# Patient Record
Sex: Female | Born: 1955 | Race: White | Hispanic: No | Marital: Married | Smoking: Never smoker
Health system: Southern US, Community
[De-identification: ages and names within clinical notes are randomized; demographics above are authoritative.]

## PROBLEM LIST (undated history)

## (undated) DIAGNOSIS — R011 Cardiac murmur, unspecified: Secondary | ICD-10-CM

## (undated) DIAGNOSIS — N951 Menopausal and female climacteric states: Secondary | ICD-10-CM

## (undated) DIAGNOSIS — N390 Urinary tract infection, site not specified: Secondary | ICD-10-CM

## (undated) DIAGNOSIS — J189 Pneumonia, unspecified organism: Secondary | ICD-10-CM

## (undated) HISTORY — DX: Menopausal and female climacteric states: N95.1

## (undated) HISTORY — DX: Urinary tract infection, site not specified: N39.0

## (undated) HISTORY — DX: Cardiac murmur, unspecified: R01.1

## (undated) HISTORY — PX: COLONOSCOPY: SHX174

## (undated) HISTORY — DX: Pneumonia, unspecified organism: J18.9

## (undated) HISTORY — PX: FOOT SURGERY: SHX648

---

## 2008-09-17 HISTORY — PX: OTHER SURGICAL HISTORY: SHX169

## 2009-04-11 ENCOUNTER — Encounter: Admission: RE | Admit: 2009-04-11 | Discharge: 2009-07-10 | Payer: Self-pay | Admitting: Orthopedic Surgery

## 2009-10-17 ENCOUNTER — Encounter: Admission: RE | Admit: 2009-10-17 | Discharge: 2009-10-17 | Payer: Self-pay | Admitting: Family Medicine

## 2009-11-14 ENCOUNTER — Encounter: Admission: RE | Admit: 2009-11-14 | Discharge: 2009-11-14 | Payer: Self-pay | Admitting: Family Medicine

## 2010-01-11 ENCOUNTER — Encounter: Admission: RE | Admit: 2010-01-11 | Discharge: 2010-02-17 | Payer: Self-pay | Admitting: Family Medicine

## 2010-02-21 ENCOUNTER — Encounter: Admission: RE | Admit: 2010-02-21 | Discharge: 2010-05-22 | Payer: Self-pay | Admitting: Family Medicine

## 2010-05-25 ENCOUNTER — Encounter: Admission: RE | Admit: 2010-05-25 | Discharge: 2010-06-01 | Payer: Self-pay | Admitting: Family Medicine

## 2010-10-08 ENCOUNTER — Encounter: Payer: Self-pay | Admitting: Family Medicine

## 2010-11-16 HISTORY — PX: WRIST SURGERY: SHX841

## 2011-01-11 ENCOUNTER — Ambulatory Visit: Payer: 59 | Attending: Orthopedic Surgery | Admitting: Physical Therapy

## 2011-01-11 DIAGNOSIS — M2560 Stiffness of unspecified joint, not elsewhere classified: Secondary | ICD-10-CM | POA: Insufficient documentation

## 2011-01-11 DIAGNOSIS — IMO0001 Reserved for inherently not codable concepts without codable children: Secondary | ICD-10-CM | POA: Insufficient documentation

## 2011-01-11 DIAGNOSIS — M25539 Pain in unspecified wrist: Secondary | ICD-10-CM | POA: Insufficient documentation

## 2011-01-11 DIAGNOSIS — M6281 Muscle weakness (generalized): Secondary | ICD-10-CM | POA: Insufficient documentation

## 2011-01-15 ENCOUNTER — Encounter: Payer: Self-pay | Admitting: Physical Therapy

## 2011-01-16 ENCOUNTER — Ambulatory Visit: Payer: 59 | Attending: Orthopedic Surgery | Admitting: Physical Therapy

## 2011-01-16 DIAGNOSIS — IMO0001 Reserved for inherently not codable concepts without codable children: Secondary | ICD-10-CM | POA: Insufficient documentation

## 2011-01-16 DIAGNOSIS — M25539 Pain in unspecified wrist: Secondary | ICD-10-CM | POA: Insufficient documentation

## 2011-01-16 DIAGNOSIS — M6281 Muscle weakness (generalized): Secondary | ICD-10-CM | POA: Insufficient documentation

## 2011-01-16 DIAGNOSIS — M2560 Stiffness of unspecified joint, not elsewhere classified: Secondary | ICD-10-CM | POA: Insufficient documentation

## 2011-01-23 ENCOUNTER — Encounter: Payer: 59 | Admitting: Physical Therapy

## 2011-01-23 ENCOUNTER — Ambulatory Visit: Payer: 59 | Admitting: Physical Therapy

## 2011-01-24 ENCOUNTER — Ambulatory Visit: Payer: 59 | Admitting: Physical Therapy

## 2011-01-25 ENCOUNTER — Encounter: Payer: 59 | Admitting: Physical Therapy

## 2011-01-30 ENCOUNTER — Encounter: Payer: 59 | Admitting: Physical Therapy

## 2011-02-07 ENCOUNTER — Ambulatory Visit: Payer: 59 | Admitting: Physical Therapy

## 2011-02-14 ENCOUNTER — Ambulatory Visit: Payer: 59 | Admitting: Physical Therapy

## 2011-02-16 ENCOUNTER — Ambulatory Visit: Payer: 59 | Attending: Orthopedic Surgery | Admitting: Physical Therapy

## 2011-02-16 DIAGNOSIS — M6281 Muscle weakness (generalized): Secondary | ICD-10-CM | POA: Insufficient documentation

## 2011-02-16 DIAGNOSIS — M2560 Stiffness of unspecified joint, not elsewhere classified: Secondary | ICD-10-CM | POA: Insufficient documentation

## 2011-02-16 DIAGNOSIS — IMO0001 Reserved for inherently not codable concepts without codable children: Secondary | ICD-10-CM | POA: Insufficient documentation

## 2011-02-16 DIAGNOSIS — M25539 Pain in unspecified wrist: Secondary | ICD-10-CM | POA: Insufficient documentation

## 2011-02-21 ENCOUNTER — Ambulatory Visit: Payer: 59 | Admitting: Physical Therapy

## 2011-02-26 ENCOUNTER — Ambulatory Visit: Payer: 59 | Admitting: Physical Therapy

## 2011-06-26 ENCOUNTER — Encounter: Payer: Self-pay | Admitting: Obstetrics & Gynecology

## 2011-06-26 ENCOUNTER — Ambulatory Visit (INDEPENDENT_AMBULATORY_CARE_PROVIDER_SITE_OTHER): Payer: 59 | Admitting: Obstetrics & Gynecology

## 2011-06-26 VITALS — BP 114/77 | HR 66 | Temp 98.0°F | Resp 16 | Ht 64.0 in | Wt 110.0 lb

## 2011-06-26 DIAGNOSIS — Z0000 Encounter for general adult medical examination without abnormal findings: Secondary | ICD-10-CM

## 2011-06-26 DIAGNOSIS — Z01419 Encounter for gynecological examination (general) (routine) without abnormal findings: Secondary | ICD-10-CM

## 2011-06-26 DIAGNOSIS — Z113 Encounter for screening for infections with a predominantly sexual mode of transmission: Secondary | ICD-10-CM

## 2011-06-26 DIAGNOSIS — Z1272 Encounter for screening for malignant neoplasm of vagina: Secondary | ICD-10-CM
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
## 2011-07-02 ENCOUNTER — Telehealth: Payer: Self-pay | Admitting: *Deleted

## 2011-07-02 NOTE — Telephone Encounter (Signed)
Pt called with the compounding drug that she has been using which is progesterone 4% cream, Disp 30gm  And apply to inner arm every night.  This was called to [facility] in [place] per Dr. Marice Potter.

## 2011-07-10 ENCOUNTER — Encounter: Payer: Self-pay | Admitting: *Deleted

## 2011-07-13 ENCOUNTER — Other Ambulatory Visit: Payer: Self-pay | Admitting: Family Medicine

## 2011-07-13 ENCOUNTER — Ambulatory Visit (INDEPENDENT_AMBULATORY_CARE_PROVIDER_SITE_OTHER): Payer: 59 | Admitting: Family Medicine

## 2011-07-13 ENCOUNTER — Ambulatory Visit
Admission: RE | Admit: 2011-07-13 | Discharge: 2011-07-13 | Disposition: A | Discharge status: Home / Self Care | Payer: 59 | Source: Ambulatory Visit | Attending: Family Medicine | Admitting: Family Medicine

## 2011-07-13 ENCOUNTER — Encounter: Payer: Self-pay | Admitting: Family Medicine

## 2011-07-13 VITALS — BP 104/64 | HR 71 | Ht 64.0 in | Wt 113.0 lb

## 2011-07-13 DIAGNOSIS — M79676 Pain in unspecified toe(s): Secondary | ICD-10-CM

## 2011-07-13 DIAGNOSIS — Z1231 Encounter for screening mammogram for malignant neoplasm of breast: Secondary | ICD-10-CM

## 2011-07-13 DIAGNOSIS — R05 Cough: Secondary | ICD-10-CM

## 2011-07-13 DIAGNOSIS — J309 Allergic rhinitis, unspecified: Secondary | ICD-10-CM | POA: Insufficient documentation

## 2011-07-13 DIAGNOSIS — S92919A Unspecified fracture of unspecified toe(s), initial encounter for closed fracture: Secondary | ICD-10-CM

## 2011-07-13 DIAGNOSIS — D860 Sarcoidosis of lung: Secondary | ICD-10-CM

## 2011-07-13 DIAGNOSIS — M79609 Pain in unspecified limb: Secondary | ICD-10-CM
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
## 2011-07-13 NOTE — Patient Instructions (Signed)
I recommend nasal saline for irrigation Also try allegra once a day for the next 4-5 days Call if not better and we will call in a antibiotic for you

## 2011-07-17 ENCOUNTER — Ambulatory Visit
Admission: RE | Admit: 2011-07-17 | Discharge: 2011-07-17 | Disposition: A | Discharge status: Home / Self Care | Payer: 59 | Source: Ambulatory Visit | Attending: Family Medicine | Admitting: Family Medicine

## 2011-07-17 DIAGNOSIS — Z1231 Encounter for screening mammogram for malignant neoplasm of breast: Secondary | ICD-10-CM

## 2011-08-06 ENCOUNTER — Telehealth: Payer: Self-pay | Admitting: *Deleted

## 2011-08-06 MED ORDER — ZOLPIDEM TARTRATE 10 MG PO TABS: ORAL_TABLET | ORAL | Status: DC

## 2011-08-06 NOTE — Telephone Encounter (Signed)
Ok for Ambien.

## 2011-08-06 NOTE — Telephone Encounter (Signed)
Pt notified and med sent. KJ LPN

## 2011-08-06 NOTE — Telephone Encounter (Signed)
Pt flying to [place] at end of this wek and wants a sleep aid for the plane going and coming back and a few for the first few nights. Animal nutritionist

## 2011-12-25 ENCOUNTER — Encounter: Payer: Self-pay | Admitting: Family Medicine

## 2011-12-25 ENCOUNTER — Ambulatory Visit (INDEPENDENT_AMBULATORY_CARE_PROVIDER_SITE_OTHER): Payer: 59 | Admitting: Family Medicine

## 2011-12-25 VITALS — BP 102/68 | HR 68 | Temp 98.3°F | Ht 64.0 in | Wt 114.0 lb

## 2011-12-25 DIAGNOSIS — R221 Localized swelling, mass and lump, neck: Secondary | ICD-10-CM

## 2011-12-25 DIAGNOSIS — R220 Localized swelling, mass and lump, head: Secondary | ICD-10-CM

## 2011-12-25 NOTE — Patient Instructions (Signed)
We will call you with the referral. 

## 2011-12-25 NOTE — Progress Notes (Signed)
  Subjective:    Patient ID: Anna Pierce, female    DOB: 1956/06/24, 56 y.o.   MRN: [id number]  HPI Feels a constant pressure in her throat almost like a pill is stuck.   Says the esophagus feels more narrow. Has been there a month. Not worse with swallowing.  No dysphagia.   No hx of thyroid problems.  Thinks this started after a cold with ST.  Feels like it is on the left side. No ear pain ore pressure.   No GERD sxs.     Review of Systems     Objective:   Physical Exam  Constitutional: She is oriented to person, place, and time. She appears well-developed and well-nourished.  HENT:  Head: Normocephalic and atraumatic.  Right Ear: External ear normal.  Left Ear: External ear normal.  Nose: Nose normal.  Mouth/Throat: Oropharynx is clear and moist.       TMs and canals are clear.   Eyes: Conjunctivae and EOM are normal. Pupils are equal, round, and reactive to light.  Neck: Neck supple. No thyromegaly present.       Small bilat ant cerv LN that are symmetric.   Cardiovascular: Normal rate, regular rhythm and normal heart sounds.   Pulmonary/Chest: Effort normal.  Lymphadenopathy:    She has cervical adenopathy.  Neurological: She is alert and oriented to person, place, and time.  Skin: Skin is warm and dry.  Psychiatric: She has a normal mood and affect.          Assessment & Plan:  Lump in throat.- Unclear etiology at this point. I do not palpate any enlargement of the thyroid gland or any nodules. She does have some small anterior cervical lymph nodes but they're on the right and the left side. We could consider an ultrasound of the neck but I think we'll go ahead and proceed with ENT referral for further evaluation. Ear exam is completely normal. She is not having any dysphasia.

## 2012-03-11 ENCOUNTER — Other Ambulatory Visit: Payer: Self-pay | Admitting: Obstetrics & Gynecology

## 2012-03-24 ENCOUNTER — Other Ambulatory Visit: Payer: Self-pay | Admitting: *Deleted

## 2012-03-24 ENCOUNTER — Telehealth: Payer: Self-pay | Admitting: Obstetrics & Gynecology

## 2012-03-24 DIAGNOSIS — N951 Menopausal and female climacteric states: Secondary | ICD-10-CM

## 2012-03-24 MED ORDER — AMBULATORY NON FORMULARY MEDICATION: Status: DC

## 2012-03-24 NOTE — Telephone Encounter (Signed)
Pt called requesting RF on her Progesterone cream to [facility].  OK's per Dr Marice Potter.  Pt is scheduled to Return in the fall for annual.

## 2012-03-24 NOTE — Telephone Encounter (Signed)
Patient called, she needs her Rx for progesterone compounded cream called to [facility] in [place].

## 2012-12-08 ENCOUNTER — Telehealth: Payer: Self-pay

## 2012-12-08 MED ORDER — CIPROFLOXACIN HCL 500 MG PO TABS: 500.0000 mg | ORAL_TABLET | Freq: Two times a day (BID) | ORAL | Status: AC

## 2012-12-08 NOTE — Telephone Encounter (Signed)
Prescription sent to [facility] pharmacy.

## 2012-12-08 NOTE — Telephone Encounter (Signed)
Anna Pierce is going on a trip to [place] and wants a prescription of Cipro to take with her. Please advised.   [facility]

## 2012-12-09 NOTE — Telephone Encounter (Signed)
Patient advised.

## 2013-03-09 ENCOUNTER — Ambulatory Visit (HOSPITAL_BASED_OUTPATIENT_CLINIC_OR_DEPARTMENT_OTHER)
Admission: RE | Admit: 2013-03-09 | Discharge: 2013-03-09 | Disposition: A | Discharge status: Home / Self Care | Payer: 59 | Source: Ambulatory Visit | Attending: Family Medicine | Admitting: Family Medicine

## 2013-03-09 ENCOUNTER — Ambulatory Visit (INDEPENDENT_AMBULATORY_CARE_PROVIDER_SITE_OTHER): Payer: 59 | Admitting: Family Medicine

## 2013-03-09 ENCOUNTER — Encounter: Payer: Self-pay | Admitting: Family Medicine

## 2013-03-09 ENCOUNTER — Ambulatory Visit: Payer: 59

## 2013-03-09 VITALS — BP 104/64 | HR 75 | Wt 111.0 lb

## 2013-03-09 DIAGNOSIS — M545 Low back pain, unspecified: Secondary | ICD-10-CM | POA: Insufficient documentation

## 2013-03-09 DIAGNOSIS — M533 Sacrococcygeal disorders, not elsewhere classified: Secondary | ICD-10-CM

## 2013-03-09 DIAGNOSIS — M549 Dorsalgia, unspecified: Secondary | ICD-10-CM | POA: Insufficient documentation
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
## 2013-03-23 ENCOUNTER — Encounter: Payer: 59 | Admitting: Family Medicine

## 2013-06-30 ENCOUNTER — Ambulatory Visit (INDEPENDENT_AMBULATORY_CARE_PROVIDER_SITE_OTHER): Payer: 59 | Admitting: Family Medicine

## 2013-06-30 ENCOUNTER — Encounter: Payer: Self-pay | Admitting: Family Medicine

## 2013-06-30 VITALS — BP 95/60 | HR 68 | Wt 113.0 lb

## 2013-06-30 DIAGNOSIS — Z0000 Encounter for general adult medical examination without abnormal findings: Secondary | ICD-10-CM

## 2013-06-30 DIAGNOSIS — Z23 Encounter for immunization: Secondary | ICD-10-CM

## 2013-06-30 DIAGNOSIS — Z1231 Encounter for screening mammogram for malignant neoplasm of breast: Secondary | ICD-10-CM

## 2013-06-30 NOTE — Patient Instructions (Signed)
Keep up a regular exercise program and make sure you are eating a healthy diet Try to eat 4 servings of dairy a day, or if you are lactose intolerant take a calcium with vitamin D daily.  Your vaccines are up to date.   

## 2013-06-30 NOTE — Progress Notes (Signed)
  Subjective:     Anna Pierce is a 57 y.o. female and is here for a comprehensive physical exam. The patient reports problems - having some more frequent hotflashes over last 2 months. Using progesterine 4% cream daily.  Applies 1ml daily. She wonders if she needs to go on her dose to 5%.  History   Social History  . Marital Status: Married    Spouse Name: Minerva Areola    Number of Children: 4  . 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    Social History Main Topics  . Smoking status: Never Smoker   . Smokeless tobacco: Never Used  . Alcohol Use: No  . Drug Use: No  . Sexual Activity: Yes    Partners: Male   Other Topics Concern  . Not on file   Social History Narrative  . No narrative on file   Health Maintenance  Topic Date Due  . Influenza Vaccine  03/30/2014  . Mammogram  07/16/2013  . Pap Smear  06/25/2014  . Colonoscopy  09/17/2018  . Tetanus/tdap  07/01/2023    The following portions of the patient's history were reviewed and updated as appropriate: allergies, current medications, past family history, past medical history, past social history, past surgical history and problem list.  Review of Systems A comprehensive review of systems was negative.   Objective:    BP 95/60  Pulse 68  Wt 113 lb (51.256 kg)  BMI 19.39 kg/m2 General appearance: alert, cooperative and appears stated age Head: Normocephalic, without obvious abnormality, atraumatic Eyes: conj clear EOMI, PERRLA Ears: normal TM's and external ear canals both ears Nose: Nares normal. Septum midline. Mucosa normal. No drainage or sinus tenderness. Throat: lips, mucosa, and tongue normal; teeth and gums normal Neck: no adenopathy, no carotid bruit, no JVD, supple, symmetrical, trachea midline and thyroid not enlarged, symmetric, no tenderness/mass/nodules Back: symmetric, no curvature. ROM normal. No CVA tenderness. Lungs: clear to auscultation  bilaterally Breasts: normal appearance, no masses or tenderness Heart: regular rate and rhythm, S1, S2 normal, no murmur, click, rub or gallop Abdomen: soft, non-tender; bowel sounds normal; no masses,  no organomegaly Extremities: extremities normal, atraumatic, no cyanosis or edema Pulses: 2+ and symmetric Skin: Skin color, texture, turgor normal. No rashes or lesions Lymph nodes: Cervical, supraclavicular, and axillary nodes normal. Neurologic: Alert and oriented X 3, normal strength and tone. Normal symmetric reflexes. Normal coordination and gait    Assessment:    Healthy female exam.      Plan:     See After Visit Summary for Counseling Recommendations  Keep up a regular exercise program and make sure you are eating a healthy diet Try to eat 4 servings of dairy a day, or if you are lactose intolerant take a calcium with vitamin D daily.  Your vaccines are up to date.   Hot flashes-will check hormone levels. Adjust dose on her progesterone cream if needed. We'll also check thyroid levels. Due for Pap smear next year. Due for mammogram this year. Ordered.

## 2013-07-06 LAB — COMPLETE METABOLIC PANEL WITH GFR
AST: 22 U/L (ref 0–37)
Albumin: 4.7 g/dL (ref 3.5–5.2)
BUN: 14 mg/dL (ref 6–23)
CO2: 27 mEq/L (ref 19–32)
Calcium: 9.4 mg/dL (ref 8.4–10.5)
Chloride: 101 mEq/L (ref 96–112)
Creat: 0.96 mg/dL (ref 0.50–1.10)
GFR, Est African American: 77 mL/min
GFR, Est Non African American: 67 mL/min
Glucose, Bld: 89 mg/dL (ref 70–99)
Potassium: 4.2 mEq/L (ref 3.5–5.3)

## 2013-07-06 LAB — LIPID PANEL
Cholesterol: 160 mg/dL (ref 0–200)
Triglycerides: 65 mg/dL (ref <150)
VLDL: 13 mg/dL (ref 0–40)

## 2013-07-06 LAB — TSH: TSH: 1.527 u[IU]/mL (ref 0.350–4.500)

## 2013-07-07 LAB — FOLLICLE STIMULATING HORMONE: FSH: 91 m[IU]/mL

## 2013-07-07 LAB — PROGESTERONE: Progesterone: 0.4 ng/mL

## 2013-07-07 LAB — ESTRADIOL: Estradiol: 85.5 pg/mL

## 2013-07-09 ENCOUNTER — Other Ambulatory Visit: Payer: Self-pay | Admitting: *Deleted

## 2013-07-09 ENCOUNTER — Encounter: Payer: Self-pay | Admitting: *Deleted

## 2013-07-09 DIAGNOSIS — N951 Menopausal and female climacteric states: Secondary | ICD-10-CM

## 2013-07-10 ENCOUNTER — Other Ambulatory Visit: Payer: Self-pay | Admitting: Family Medicine

## 2013-07-10 MED ORDER — PROGESTERONE MICRONIZED POWD: Status: DC

## 2013-07-14 ENCOUNTER — Ambulatory Visit (INDEPENDENT_AMBULATORY_CARE_PROVIDER_SITE_OTHER): Payer: 59

## 2013-07-14 DIAGNOSIS — Z1231 Encounter for screening mammogram for malignant neoplasm of breast: Secondary | ICD-10-CM

## 2013-08-17 ENCOUNTER — Ambulatory Visit (INDEPENDENT_AMBULATORY_CARE_PROVIDER_SITE_OTHER): Payer: 59

## 2013-08-17 ENCOUNTER — Other Ambulatory Visit: Payer: Self-pay | Admitting: Family Medicine

## 2013-08-17 ENCOUNTER — Encounter: Payer: Self-pay | Admitting: Physician Assistant

## 2013-08-17 ENCOUNTER — Ambulatory Visit (INDEPENDENT_AMBULATORY_CARE_PROVIDER_SITE_OTHER): Payer: 59 | Admitting: Physician Assistant

## 2013-08-17 ENCOUNTER — Inpatient Hospital Stay
Admission: RE | Admit: 2013-08-17 | Discharge: 2013-08-17 | Disposition: A | Discharge status: Home / Self Care | Payer: Self-pay | Source: Ambulatory Visit | Attending: Physician Assistant | Admitting: Physician Assistant

## 2013-08-17 VITALS — BP 124/67 | HR 59 | Resp 16 | Wt 113.0 lb

## 2013-08-17 DIAGNOSIS — S59909A Unspecified injury of unspecified elbow, initial encounter: Secondary | ICD-10-CM

## 2013-08-17 DIAGNOSIS — S6992XA Unspecified injury of left wrist, hand and finger(s), initial encounter: Secondary | ICD-10-CM

## 2013-08-17 DIAGNOSIS — R296 Repeated falls: Secondary | ICD-10-CM

## 2013-08-17 DIAGNOSIS — S6990XA Unspecified injury of unspecified wrist, hand and finger(s), initial encounter: Secondary | ICD-10-CM

## 2013-08-17 DIAGNOSIS — Z79890 Hormone replacement therapy (postmenopausal): Secondary | ICD-10-CM

## 2013-08-17 MED ORDER — OXYCODONE-ACETAMINOPHEN 5-325 MG PO TABS: ORAL_TABLET | ORAL | Status: DC

## 2013-08-17 NOTE — Patient Instructions (Signed)
Wrist Sprain °with Rehab °A sprain is an injury in which a ligament that maintains the proper alignment of a joint is partially or completely torn. The ligaments of the wrist are susceptible to sprains. Sprains are classified into three categories. Grade 1 sprains cause pain, but the tendon is not lengthened. Grade 2 sprains include a lengthened ligament because the ligament is stretched or partially ruptured. With grade 2 sprains there is still function, although the function may be diminished. Grade 3 sprains are characterized by a complete tear of the tendon or muscle, and function is usually impaired. °SYMPTOMS  °· Pain tenderness, inflammation, and/or bruising (contusion) of the injury. °· A "pop" or tear felt and/or heard at the time of injury. °· Decreased wrist function. °CAUSES  °A wrist sprain occurs when a force is placed on one or more ligaments that is greater than it/they can withstand. Common mechanisms of injury include: °· Catching a ball with you hands. °· Repetitive and/ or strenuous extension or flexion of the wrist. °RISK INCREASES WITH: °· Previous wrist injury. °· Contact sports (boxing or wrestling). °· Activities in which falling is common. °· Poor strength and flexibility. °· Improperly fitted or padded protective equipment. °PREVENTION °· Warm up and stretch properly before activity. °· Allow for adequate recovery between workouts. °· Maintain physical fitness: °· Strength, flexibility, and endurance. °· Cardiovascular fitness. °· Protect the wrist joint by limiting its motion with the use of taping, braces, or splints. °· Protect the wrist after injury for 6 to 12 months. °PROGNOSIS  °The prognosis for wrist sprains depends on the degree of injury. Grade 1 sprains require 2 to 6 weeks of treatment. Grade 2 sprains require 6 to 8 weeks of treatment, and grade 3 sprains require up to 12 weeks.  °RELATED COMPLICATIONS  °· Prolonged healing time, if improperly treated or  re-injured. °· Recurrent symptoms that result in a chronic problem. °· Injury to nearby structures (bone, cartilage, nerves, or tendons). °· Arthritis of the wrist. °· Inability to compete in athletics at a high level. °· Wrist stiffness or weakness. °· Progression to a complete rupture of the ligament. °TREATMENT  °Treatment initially involves resting from any activities that aggravate the symptoms, and the use of ice and medications to help reduce pain and inflammation. Your caregiver may recommend immobilizing the wrist for a period of time in order to reduce stress on the ligament and allow for healing. After immobilization it is important to perform strengthening and stretching exercises to help regain strength and a full range of motion. These exercises may be completed at home or with a therapist. Surgery is not usually required for wrist sprains, unless the ligament has been ruptured (grade 3 sprain). °MEDICATION  °· If pain medication is necessary, then nonsteroidal anti-inflammatory medications, such as aspirin and ibuprofen, or other minor pain relievers, such as acetaminophen, are often recommended. °· Do not take pain medication for 7 days before surgery. °· Prescription pain relievers may be given if deemed necessary by your caregiver. Use only as directed and only as much as you need. °HEAT AND COLD °· Cold treatment (icing) relieves pain and reduces inflammation. Cold treatment should be applied for 10 to 15 minutes every 2 to 3 hours for inflammation and pain and immediately after any activity that aggravates your symptoms. Use ice packs or massage the area with a piece of ice (ice massage). °· Heat treatment may be used prior to performing the stretching and strengthening activities prescribed by your   caregiver, physical therapist, or athletic trainer. Use a heat pack or soak your injury in warm water. °SEEK MEDICAL CARE IF: °· Treatment seems to offer no benefit, or the condition worsens. °· Any  medications produce adverse side effects. °EXERCISES °RANGE OF MOTION (ROM) AND STRETCHING EXERCISES - Wrist Sprain  °These exercises may help you when beginning to rehabilitate your injury. Your symptoms may resolve with or without further involvement from your physician, physical therapist or athletic trainer. While completing these exercises, remember:  °· Restoring tissue flexibility helps normal motion to return to the joints. This allows healthier, less painful movement and activity. °· An effective stretch should be held for at least 30 seconds. °· A stretch should never be painful. You should only feel a gentle lengthening or release in the stretched tissue. °RANGE OF MOTION  Wrist Flexion, Active-Assisted °· Extend your right / left elbow with your fingers pointing down.* °· Gently pull the back of your hand towards you until you feel a gentle stretch on the top of your forearm. °· Hold this position for __________ seconds. °Repeat __________ times. Complete this exercise __________ times per day.  °*If directed by your physician, physical therapist or athletic trainer, complete this stretch with your elbow bent rather than extended. °RANGE OF MOTION  Wrist Extension, Active-Assisted °· Extend your right / left elbow and turn your palm upwards.* °· Gently pull your palm/fingertips back so your wrist extends and your fingers point more toward the ground. °· You should feel a gentle stretch on the inside of your forearm. °· Hold this position for __________ seconds. °Repeat __________ times. Complete this exercise __________ times per day. °*If directed by your physician, physical therapist or athletic trainer, complete this stretch with your elbow bent, rather than extended. °RANGE OF MOTION  Supination, Active °· Stand or sit with your elbows at your side. Bend your right / left elbow to 90 degrees. °· Turn your palm upward until you feel a gentle stretch on the inside of your forearm. °· Hold this position  for __________ seconds. Slowly release and return to the starting position. °Repeat __________ times. Complete this stretch __________ times per day.  °RANGE OF MOTION  Pronation, Active °· Stand or sit with your elbows at your side. Bend your right / left elbow to 90 degrees. °· Turn your palm downward until you feel a gentle stretch on the top of your forearm. °· Hold this position for __________ seconds. Slowly release and return to the starting position. °Repeat __________ times. Complete this stretch __________ times per day.  °STRETCH - Wrist Flexion °· Place the back of your right / left hand on a tabletop leaving your elbow slightly bent. Your fingers should point away from your body. °· Gently press the back of your hand down onto the table by straightening your elbow. You should feel a stretch on the top of your forearm. °· Hold this position for __________ seconds. °Repeat __________ times. Complete this stretch __________ times per day.  °STRETCH  Wrist Extension °· Place your right / left fingertips on a tabletop leaving your elbow slightly bent. Your fingers should point backwards. °· Gently press your fingers and palm down onto the table by straightening your elbow. You should feel a stretch on the inside of your forearm. °· Hold this position for __________ seconds. °Repeat __________ times. Complete this stretch __________ times per day.  °STRENGTHENING EXERCISES - Wrist Sprain °These exercises may help you when beginning to rehabilitate your injury.   They may resolve your symptoms with or without further involvement from your physician, physical therapist or athletic trainer. While completing these exercises, remember:  °· Muscles can gain both the endurance and the strength needed for everyday activities through controlled exercises. °· Complete these exercises as instructed by your physician, physical therapist or athletic trainer. Progress with the resistance and repetition exercises only as your  caregiver advises. °STRENGTH Wrist Flexors °· Sit with your right / left forearm palm-up and fully supported. Your elbow should be resting below the height of your shoulder. Allow your wrist to extend over the edge of the surface. °· Loosely holding a __________ weight or a piece of rubber exercise band/tubing, slowly curl your hand up toward your forearm. °· Hold this position for __________ seconds. Slowly lower the wrist back to the starting position in a controlled manner. °Repeat __________ times. Complete this exercise __________ times per day.  °STRENGTH  Wrist Extensors °· Sit with your right / left forearm palm-down and fully supported. Your elbow should be resting below the height of your shoulder. Allow your wrist to extend over the edge of the surface. °· Loosely holding a __________ weight or a piece of rubber exercise band/tubing, slowly curl your hand up toward your forearm. °· Hold this position for __________ seconds. Slowly lower the wrist back to the starting position in a controlled manner. °Repeat __________ times. Complete this exercise __________ times per day.  °STRENGTH - Ulnar Deviators °· Stand with a ____________________ weight in your right / left hand, or sit holding on to the rubber exercise band/tubing with your opposite arm supported. °· Move your wrist so that your pinkie travels toward your forearm and your thumb moves away from your forearm. °· Hold this position for __________ seconds and then slowly lower the wrist back to the starting position. °Repeat __________ times. Complete this exercise __________ times per day °STRENGTH - Radial Deviators °· Stand with a ____________________ weight in your °· right / left hand, or sit holding on to the rubber exercise band/tubing with your arm supported. °· Raise your hand upward in front of you or pull up on the rubber tubing. °· Hold this position for __________ seconds and then slowly lower the wrist back to the starting  position. °Repeat __________ times. Complete this exercise __________ times per day. °STRENGTH  Forearm Supinators °· Sit with your right / left forearm supported on a table, keeping your elbow below shoulder height. Rest your hand over the edge, palm down. °· Gently grip a hammer or a soup ladle. °· Without moving your elbow, slowly turn your palm and hand upward to a "thumbs-up" position. °· Hold this position for __________ seconds. Slowly return to the starting position. °Repeat __________ times. Complete this exercise __________ times per day.  °STRENGTH  Forearm Pronators °· Sit with your right / left forearm supported on a table, keeping your elbow below shoulder height. Rest your hand over the edge, palm up. °· Gently grip a hammer or a soup ladle. °· Without moving your elbow, slowly turn your palm and hand upward to a "thumbs-up" position. °· Hold this position for __________ seconds. Slowly return to the starting position. °Repeat __________ times. Complete this exercise __________ times per day.  °STRENGTH - Grip °· Grasp a tennis ball, a dense sponge, or a large, rolled sock in your hand. °· Squeeze as hard as you can without increasing any pain. °· Hold this position for __________ seconds. Release your grip slowly. °Repeat   __________ times. Complete this exercise __________ times per day.  °Document Released: 09/03/2005 Document Revised: 11/26/2011 Document Reviewed: 12/16/2008 °[employer]® Patient Information ©2014 [employer]. ° °

## 2013-08-17 NOTE — Progress Notes (Signed)
   Subjective:    Patient ID: Anna Pierce, female    DOB: September 16, 1956, 57 y.o.   MRN: [id number]  HPI Patient is a 57 yo female who fell backwards onto her left wrist after saddling her horse one day ago. She has had a lot of swelling and pain. She has broken her right wrist before and wanted to make sure not broken. She iced and put in ace bandage and taken ibuprofen which has helped some. 8/10 pain with movment, less as long as she keeps still.      Review of Systems     Objective:   Physical Exam  Musculoskeletal:  Left hand significant edema around all MCP joints and into phalanges. Strength and hand grip decreased significantly. Intact sensation to dull and sharp touch. Decreased ROM at wrist and phalanges. Unable to close fist. Pain with palpation over lateral ulnar head and into hand. No bruising.           Assessment & Plan:  Left wrist injury- Xray stat was negative for fracture which was surprising. I viewed xray myself and also confirmed no fracture. It appears that patient has badly sprained wrist. Gave percocet for pain. Per pt she tolerates better. Wrapped in ace bandage. Encouraged Ice and Ibuprofen. After 3 days of rest, start with ROM exercises. If no improvement of pain or ROM will get MRI of hand.

## 2013-09-02 ENCOUNTER — Other Ambulatory Visit: Payer: Self-pay | Admitting: *Deleted

## 2013-09-02 LAB — LUTEINIZING HORMONE: LH: 56.9 m[IU]/mL

## 2013-09-02 LAB — FOLLICLE STIMULATING HORMONE: FSH: 119.2 m[IU]/mL — ABNORMAL HIGH

## 2013-09-02 LAB — TSH: TSH: 1.333 u[IU]/mL (ref 0.350–4.500)

## 2013-09-02 LAB — ESTRADIOL: Estradiol: 30.4 pg/mL

## 2013-09-04 ENCOUNTER — Encounter: Payer: Self-pay | Admitting: Family Medicine

## 2013-09-07 ENCOUNTER — Ambulatory Visit (INDEPENDENT_AMBULATORY_CARE_PROVIDER_SITE_OTHER): Payer: 59 | Admitting: Sports Medicine

## 2013-09-07 ENCOUNTER — Other Ambulatory Visit: Payer: 59

## 2013-09-07 ENCOUNTER — Encounter: Payer: Self-pay | Admitting: Sports Medicine

## 2013-09-07 ENCOUNTER — Telehealth: Payer: Self-pay | Admitting: *Deleted

## 2013-09-07 VITALS — BP 120/79 | HR 62 | Wt 112.0 lb

## 2013-09-07 DIAGNOSIS — S52599A Other fractures of lower end of unspecified radius, initial encounter for closed fracture: Secondary | ICD-10-CM

## 2013-09-07 DIAGNOSIS — S52502A Unspecified fracture of the lower end of left radius, initial encounter for closed fracture: Secondary | ICD-10-CM

## 2013-09-07 HISTORY — DX: Unspecified fracture of the lower end of left radius, initial encounter for closed fracture: S52.502A

## 2013-09-07 NOTE — Progress Notes (Signed)
   Subjective:    I'm seeing this patient as a consultation for:  Anna Gaw, PA-C  CC: Left wrist pain  HPI: This is a pleasant 57 year old female, 3 weeks ago she fell, and hyperflexed her wrist, she had immediate pain, swelling, and bruising which has persisted. Initial x-rays were negative, unfortunately she has continued to have pain and presents to me for further evaluation and treatment. Pain is localized over the distal or so radius without radiation, she does have some numbness in the median nerve distribution. Symptoms are moderate, persistent.  Past medical history, Surgical history, Family history not pertinant except as noted below, Social history, Allergies, and medications have been entered into the medical record, reviewed, and no changes needed.   Review of Systems: No headache, visual changes, nausea, vomiting, diarrhea, constipation, dizziness, abdominal pain, skin rash, fevers, chills, night sweats, weight loss, swollen lymph nodes, body aches, joint swelling, muscle aches, chest pain, shortness of breath, mood changes, visual or auditory hallucinations.   Objective:   General: Well Developed, well nourished, and in no acute distress.  Neuro/Psych: Alert and oriented x3, extra-ocular muscles intact, able to move all 4 extremities, sensation grossly intact. Skin: Warm and dry, no rashes noted.  Respiratory: Not using accessory muscles, speaking in full sentences, trachea midline.  Cardiovascular: Pulses palpable, no extremity edema. Abdomen: Does not appear distended. Left wrist: Tender to palpation over Listers tubercle, there is visible swelling and bruising, she does get some positive Tinel's sign over carpal tunnel. Good motion, good strength.  X-rays were personally reviewed, I do see what appears to be an avulsion from either the listers tubercle/radial styloid process. This was discussed with the radiologist, an addendum was placed on the x-ray report.  The  patient was sent down for confirmation CT scan however at the last minute declined.  Impression and Recommendations:   This case required medical decision making of moderate complexity.

## 2013-09-07 NOTE — Assessment & Plan Note (Addendum)
Velcro brace. CT for confirmation, I do see a subtle fracture through listers tubercle. Return in 3 weeks to recheck. Continue Percocet.  I billed a fracture code for this visit, all subsequent visits for this complaint will be "post-op checks" in the global period.  The patient was sent down for a confirmatory CT, however at the last minute she declined citing worry about radiation from a CT.

## 2013-09-07 NOTE — Telephone Encounter (Signed)
PA obtained for CT Lt Wrist w/o. Auth # [id number].  Meyer Cory, LPN

## 2013-09-14 ENCOUNTER — Encounter: Payer: Self-pay | Admitting: Sports Medicine

## 2013-10-01 ENCOUNTER — Ambulatory Visit (INDEPENDENT_AMBULATORY_CARE_PROVIDER_SITE_OTHER): Payer: 59 | Admitting: Sports Medicine

## 2013-10-01 ENCOUNTER — Encounter: Payer: Self-pay | Admitting: Sports Medicine

## 2013-10-01 VITALS — BP 116/73 | HR 65 | Ht 64.0 in | Wt 115.0 lb

## 2013-10-01 DIAGNOSIS — S52599A Other fractures of lower end of unspecified radius, initial encounter for closed fracture: Secondary | ICD-10-CM

## 2013-10-01 DIAGNOSIS — S52502A Unspecified fracture of the lower end of left radius, initial encounter for closed fracture: Secondary | ICD-10-CM

## 2013-10-01 NOTE — Progress Notes (Signed)
  Subjective: 6 weeks status post fracture of the radial styloid process, avulsion. Pain continues to improve. She is now experiencing some burning sensation along the flexor carpi ulnaris tendon.   Objective: General: Well-developed, well-nourished, and in no acute distress. Left wrist: still tender to palpation over Listers Tubercle. There is also some pain over the flexor carpi ulnaris tendon. Excellent motion, excellent strength. Negative Tinel's and Phalen's signs.  Assessment/plan:

## 2013-10-01 NOTE — Assessment & Plan Note (Signed)
Drastic improvement since last visit. She is 6 weeks out. She is having some burning pain that she localizes along the flexor carpi ulnaris tendon.  continue wrist brace for 2 weeks, then 2 weeks of rehabilitation. Return in one month, we can further evaluate the FCU tendon if still symptomatic at that time.

## 2014-06-29 ENCOUNTER — Telehealth: Payer: Self-pay

## 2014-06-29 DIAGNOSIS — N951 Menopausal and female climacteric states: Secondary | ICD-10-CM

## 2014-06-29 DIAGNOSIS — Z0000 Encounter for general adult medical examination without abnormal findings: Secondary | ICD-10-CM

## 2014-06-29 NOTE — Telephone Encounter (Signed)
Anna Pierce would like her labs done prior to her physical appointment. What labs would you like me to order?

## 2014-06-29 NOTE — Telephone Encounter (Signed)
Faxed to lab.

## 2014-06-29 NOTE — Telephone Encounter (Signed)
Cmp, lipids. If still on hormones then check estradiol, fsh, lh, and progesterone.

## 2014-07-01 LAB — COMPLETE METABOLIC PANEL WITH GFR
ALBUMIN: 4.3 g/dL (ref 3.5–5.2)
ALK PHOS: 70 U/L (ref 39–117)
ALT: 22 U/L (ref 0–35)
AST: 19 U/L (ref 0–37)
BUN: 10 mg/dL (ref 6–23)
CALCIUM: 9.5 mg/dL (ref 8.4–10.5)
CHLORIDE: 100 meq/L (ref 96–112)
CO2: 26 mEq/L (ref 19–32)
Creat: 1.02 mg/dL (ref 0.50–1.10)
GFR, EST AFRICAN AMERICAN: 71 mL/min
GFR, Est Non African American: 62 mL/min
Glucose, Bld: 88 mg/dL (ref 70–99)
Potassium: 4.1 mEq/L (ref 3.5–5.3)
Sodium: 136 mEq/L (ref 135–145)
Total Bilirubin: 0.9 mg/dL (ref 0.2–1.2)
Total Protein: 7 g/dL (ref 6.0–8.3)

## 2014-07-01 LAB — LIPID PANEL
CHOLESTEROL: 142 mg/dL (ref 0–200)
HDL: 58 mg/dL (ref >39)
LDL Cholesterol: 71 mg/dL (ref 0–99)
Total CHOL/HDL Ratio: 2.4 Ratio
Triglycerides: 67 mg/dL (ref <150)
VLDL: 13 mg/dL (ref 0–40)

## 2014-07-02 LAB — ESTRADIOL, FREE

## 2014-07-05 ENCOUNTER — Other Ambulatory Visit: Payer: Self-pay

## 2014-07-05 ENCOUNTER — Encounter: Payer: Self-pay | Admitting: Family Medicine

## 2014-07-05 ENCOUNTER — Ambulatory Visit (INDEPENDENT_AMBULATORY_CARE_PROVIDER_SITE_OTHER): Payer: 59 | Admitting: Family Medicine

## 2014-07-05 ENCOUNTER — Other Ambulatory Visit: Payer: Self-pay | Admitting: Family Medicine

## 2014-07-05 ENCOUNTER — Other Ambulatory Visit (HOSPITAL_COMMUNITY)
Admission: RE | Admit: 2014-07-05 | Discharge: 2014-07-05 | Disposition: A | Discharge status: Home / Self Care | Payer: 59 | Source: Ambulatory Visit | Attending: Family Medicine | Admitting: Family Medicine

## 2014-07-05 VITALS — BP 107/53 | HR 60 | Ht 64.0 in | Wt 114.0 lb

## 2014-07-05 DIAGNOSIS — Z0189 Encounter for other specified special examinations: Secondary | ICD-10-CM

## 2014-07-05 DIAGNOSIS — R140 Abdominal distension (gaseous): Secondary | ICD-10-CM

## 2014-07-05 DIAGNOSIS — R143 Flatulence: Secondary | ICD-10-CM

## 2014-07-05 DIAGNOSIS — Z1231 Encounter for screening mammogram for malignant neoplasm of breast: Secondary | ICD-10-CM

## 2014-07-05 DIAGNOSIS — Z780 Asymptomatic menopausal state: Secondary | ICD-10-CM

## 2014-07-05 DIAGNOSIS — Z0000 Encounter for general adult medical examination without abnormal findings: Secondary | ICD-10-CM

## 2014-07-05 DIAGNOSIS — Z1151 Encounter for screening for human papillomavirus (HPV): Secondary | ICD-10-CM | POA: Insufficient documentation

## 2014-07-05 DIAGNOSIS — R142 Eructation: Secondary | ICD-10-CM

## 2014-07-05 DIAGNOSIS — Z01419 Encounter for gynecological examination (general) (routine) without abnormal findings: Secondary | ICD-10-CM | POA: Diagnosis present

## 2014-07-05 DIAGNOSIS — R141 Gas pain: Secondary | ICD-10-CM

## 2014-07-05 LAB — PROGESTERONE: PROGESTERONE: 0.8 ng/mL

## 2014-07-05 LAB — ESTRADIOL: Estradiol: 105 pg/mL

## 2014-07-05 LAB — FOLLICLE STIMULATING HORMONE: FSH: 44.7 m[IU]/mL

## 2014-07-05 LAB — LUTEINIZING HORMONE: LH: 25.8 m[IU]/mL

## 2014-07-05 MED ORDER — PROGESTERONE MICRONIZED POWD: Status: DC

## 2014-07-05 NOTE — Progress Notes (Signed)
Subjective:     Anna Pierce is a 58 y.o. female and is here for a comprehensive physical exam. The patient reports no problems.  Feeling really bloated with her bowels and gassy.  Says she is taking a probiotic.  She has gained a couple of pounds.  She feels like retaining water. She drinks aloe drinks to help with constipation.  No milks prodcuts.  No blood int eh stool. Started about a few months ago.    History   Social History  . Marital Status: Married    Spouse Name: Randall Hiss    Number of Children: 4  . Years of Education: N/A   Occupational History  . 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    Social History Main Topics  . Smoking status: Never Smoker   . Smokeless tobacco: Never Used  . Alcohol Use: No  . Drug Use: No  . Sexual Activity: Yes    Partners: Male   Other Topics Concern  . Not on file   Social History Narrative  . No narrative on file   Health Maintenance  Topic Date Due  . Pap Smear  06/25/2014  . Influenza Vaccine  07/06/2015 (Originally 04/17/2014)  . Mammogram  07/15/2015  . Colonoscopy  09/17/2018  . Tetanus/tdap  07/01/2023    The following portions of the patient's history were reviewed and updated as appropriate: allergies, current medications, past family history, past medical history, past social history, past surgical history and problem list.  Review of Systems A comprehensive review of systems was negative.   Objective:    There were no vitals taken for this visit. General appearance: alert, cooperative and appears older than stated age Head: Normocephalic, without obvious abnormality, atraumatic Eyes: conj clear, EOMi, PEERLA Ears: normal TM's and external ear canals both ears Nose: Nares normal. Septum midline. Mucosa normal. No drainage or sinus tenderness. Throat: lips, mucosa, and tongue normal; teeth and gums normal Neck: no adenopathy, no carotid bruit, no JVD, supple, symmetrical, trachea midline and thyroid not  enlarged, symmetric, no tenderness/mass/nodules Back: symmetric, no curvature. ROM normal. No CVA tenderness. Lungs: clear to auscultation bilaterally Breasts: normal appearance, no masses or tenderness Heart: regular rate and rhythm, S1, S2 normal, no murmur, click, rub or gallop Abdomen: soft, non-tender; bowel sounds normal; no masses,  no organomegaly Pelvic: cervix normal in appearance, external genitalia normal, no adnexal masses or tenderness, no cervical motion tenderness, rectovaginal septum normal, uterus normal size, shape, and consistency and vagina normal without discharge Extremities: extremities normal, atraumatic, no cyanosis or edema Pulses: 2+ and symmetric Skin: Skin color, texture, turgor normal. No rashes or lesions Lymph nodes: Cervical, supraclavicular, and axillary nodes normal. Neurologic: Alert and oriented X 3, normal strength and tone. Normal symmetric reflexes. Normal coordination and gait    Assessment:    Healthy female exam.      Plan:     See After Visit Summary for Counseling Recommendations  Keep up a regular exercise program and make sure you are eating a healthy diet Try to eat 4 servings of dairy a day, or if you are lactose intolerant take a calcium with vitamin D daily.  Your vaccines are up to date.  Will call with pap smear results when available.    Bloating - unclear etiology. We'll check her thyroid as well as check her for gluten insensitivity. Ciardi avoids dairy products. And Ciardi takes a probiotic in addition to using allergies to help move her  bowels more normally.

## 2014-07-05 NOTE — Patient Instructions (Signed)
complete physical examination Keep up a regular exercise program and make sure you are eating a healthy diet Try to eat 4 servings of dairy a day, or if you are lactose intolerant take a calcium with vitamin D daily.  Your vaccines are up to date.   

## 2014-07-06 LAB — TSH: TSH: 1.52 u[IU]/mL (ref 0.350–4.500)

## 2014-07-06 LAB — TISSUE TRANSGLUTAMINASE, IGA: TISSUE TRANSGLUTAMINASE AB, IGA: 6.3 U/mL (ref <20)

## 2014-07-06 LAB — GLIADIN ANTIBODIES, SERUM
GLIADIN IGA: 6.3 U/mL (ref <20)
Gliadin IgG: 4.3 U/mL (ref <20)

## 2014-07-06 LAB — CYTOLOGY - PAP

## 2014-07-07 NOTE — Progress Notes (Signed)
Quick Note:  Call patient: Your Pap smear is normal. Repeat in 3 years. ______ 

## 2014-07-19 ENCOUNTER — Encounter: Payer: Self-pay | Admitting: Family Medicine

## 2014-11-13 ENCOUNTER — Emergency Department
Admission: EM | Admit: 2014-11-13 | Discharge: 2014-11-13 | Disposition: A | Discharge status: Home / Self Care | Payer: 59 | Source: Home / Self Care | Attending: Family Medicine | Admitting: Family Medicine

## 2014-11-13 DIAGNOSIS — J069 Acute upper respiratory infection, unspecified: Secondary | ICD-10-CM

## 2014-11-13 MED ORDER — FLUCONAZOLE 150 MG PO TABS: 150.0000 mg | ORAL_TABLET | Freq: Once | ORAL | Status: DC

## 2014-11-13 MED ORDER — AZITHROMYCIN 250 MG PO TABS: ORAL_TABLET | ORAL | Status: DC

## 2014-11-13 NOTE — ED Notes (Signed)
Patient states that she is only here today because she would like to have a antibiotic prescribed for preventative reasons.  She states that she has been staying with her mother who was diagnosed with Mycroplasma pneumonia approx 6 weeks ago. She only has mild cold symptoms today.

## 2014-11-13 NOTE — Discharge Instructions (Signed)
Take plain guaifenesin (600mg  or1200mg  extended release tabs such as Mucinex) twice daily, with plenty of water, for cough and congestion.  May add Pseudoephedrine for sinus congestion.  Get adequate rest.   May use Afrin nasal spray (or generic oxymetazoline) twice daily for about 5 days.  Also recommend using saline nasal spray several times daily and saline nasal irrigation (AYR is a common brand).   Try warm salt water gargles for sore throat.  Stop all antihistamines for now, and other non-prescription cough/cold preparations. May take Ibuprofen 200mg , 4 tabs every 8 hours with food for body aches, headache, etc. May take Delsym Cough Suppressant at bedtime for nighttime cough.  Follow-up with family doctor if not improving about10 days.
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
## 2015-01-12 ENCOUNTER — Other Ambulatory Visit: Payer: Self-pay | Admitting: *Deleted

## 2015-01-12 MED ORDER — ZOLPIDEM TARTRATE 10 MG PO TABS: ORAL_TABLET | ORAL | Status: DC

## 2015-01-12 NOTE — Telephone Encounter (Signed)
Pt is going to [place] and is requesting something for sleep. rx sent.Audelia Hives Marvell

## 2016-02-03 ENCOUNTER — Other Ambulatory Visit: Payer: Self-pay | Admitting: *Deleted

## 2016-02-03 ENCOUNTER — Encounter: Payer: Self-pay | Admitting: Physician Assistant

## 2016-02-03 ENCOUNTER — Ambulatory Visit (INDEPENDENT_AMBULATORY_CARE_PROVIDER_SITE_OTHER): Payer: 59 | Admitting: Physician Assistant

## 2016-02-03 ENCOUNTER — Telehealth: Payer: Self-pay | Admitting: Family Medicine

## 2016-02-03 VITALS — BP 125/90 | HR 84 | Ht 64.0 in | Wt 109.0 lb

## 2016-02-03 DIAGNOSIS — L237 Allergic contact dermatitis due to plants, except food: Secondary | ICD-10-CM

## 2016-02-03 MED ORDER — METHYLPREDNISOLONE SODIUM SUCC 125 MG IJ SOLR
125.0000 mg | Freq: Once | INTRAMUSCULAR | Status: AC
  Administered 2016-02-03: 125 mg via INTRAMUSCULAR

## 2016-02-03 MED ORDER — CLOBETASOL PROPIONATE 0.05 % EX CREA: 1.0000 "application " | TOPICAL_CREAM | Freq: Two times a day (BID) | CUTANEOUS | Status: DC

## 2016-02-03 MED ORDER — CIPROFLOXACIN HCL 500 MG PO TABS: 500.0000 mg | ORAL_TABLET | Freq: Two times a day (BID) | ORAL | Status: DC

## 2016-02-03 NOTE — Telephone Encounter (Signed)
Patient needs her pharmacy changed to [facility] on [address] in [place] instead of [facility]... She has gone to [facility] since script was sent their today but she mentioned in her appt today to have changed and it wasn't done. Thanks

## 2016-02-03 NOTE — Patient Instructions (Signed)

## 2016-02-03 NOTE — Progress Notes (Signed)
   Subjective:    Patient ID: Anna Pierce, female    DOB: [birth year], 58 y.o.   MRN: [id number]  HPI Pt presents to the clinic with poison oak dermatitis. She has itchy rash with vessicles mainly over right forearm but also on left forearm and right hip. No fever or chills. She has tried calamine lotion with some relief. No SOB or cough. She has been working in yard a lot.   Pt is getting ready to leave for [place]. She has taken cipro on trip before for diarrhea. Wonders if she can have again.    Review of Systems  All other systems reviewed and are negative.      Objective:   Physical Exam  Constitutional: She is oriented to person, place, and time. She appears well-developed and well-nourished.  HENT:  Head: Normocephalic and atraumatic.  Cardiovascular: Normal rate, regular rhythm and normal heart sounds.   Pulmonary/Chest: Effort normal and breath sounds normal.  Neurological: She is alert and oriented to person, place, and time.  Skin:     Psychiatric: She has a normal mood and affect. Her behavior is normal.          Assessment & Plan:  Contact dermatitis due to poison oak- discuss prednisone but she does not tolerate very well. Never had solumedrol agreed to try. Solumedrol 125mg  IM given today. Topical clobetasol given to use as needed bid for itching and rash. HO given for prevention.   Travel- cipro given for travelors diarrhea prevention/treatment.

## 2016-02-07 NOTE — Telephone Encounter (Signed)
Pharmacy switched.Anna Pierce

## 2016-04-02 ENCOUNTER — Ambulatory Visit (INDEPENDENT_AMBULATORY_CARE_PROVIDER_SITE_OTHER): Payer: 59 | Admitting: Family Medicine

## 2016-04-02 ENCOUNTER — Encounter: Payer: Self-pay | Admitting: Family Medicine

## 2016-04-02 VITALS — BP 119/63 | HR 60 | Wt 111.0 lb

## 2016-04-02 DIAGNOSIS — Z0000 Encounter for general adult medical examination without abnormal findings: Secondary | ICD-10-CM | POA: Diagnosis not present

## 2016-04-02 DIAGNOSIS — N951 Menopausal and female climacteric states: Secondary | ICD-10-CM

## 2016-04-02 DIAGNOSIS — Z8249 Family history of ischemic heart disease and other diseases of the circulatory system: Secondary | ICD-10-CM | POA: Diagnosis not present

## 2016-04-02 DIAGNOSIS — R232 Flushing: Secondary | ICD-10-CM

## 2016-04-02 NOTE — Progress Notes (Signed)
Subjective:     Anna Pierce is a 60 y.o. female and is here for a comprehensive physical exam. The patient reports no problems.  Overall she is doing well. She still exercises regularly. 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 Sleep is fair. Hot flashes are still waking her up at night. Last saw her she was concerned about some recent weight gain. She decided to go on a low carb diet and says it has made a huge difference in how she feels and she was able to lose the weight. She is seeing another provider who is out of network he recommended doing some additional testing for vitamin D thyroid panel CRP etc. She would like to have that done here at our location if possible. Last mammogram was in 2014. She does have a family history of heart disease.  Social History   Social History  . Marital Status: Married    Spouse Name: Randall Hiss  . Number of Children: 4  . Years of Education: N/A   Occupational History  . 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    Social History Main Topics  . Smoking status: Never Smoker   . Smokeless tobacco: Never Used  . Alcohol Use: No  . Drug Use: No  . Sexual Activity:    Partners: Male   Other Topics Concern  . Not on file   Social History Narrative   Health Maintenance  Topic Date Due  . Hepatitis C Screening  09/30/1955  . HIV Screening  10/08/1972  . MAMMOGRAM  07/15/2015  . INFLUENZA VACCINE  04/17/2016  . PAP SMEAR  07/05/2017  . COLONOSCOPY  09/17/2018  . TETANUS/TDAP  07/01/2023    The following portions of the patient's history were reviewed and updated as appropriate: allergies, current medications, past family history, past medical history, past social history, past surgical history and problem list.  Review of Systems A comprehensive review of systems was negative.   Objective:    BP 119/63 mmHg  Pulse 60  Wt 111 lb (50.349 kg)  SpO2 100% General appearance: alert, cooperative and  appears stated age Head: Normocephalic, without obvious abnormality, atraumatic Eyes: conj clear, EOMI, PEERLA Ears: normal TM's and external ear canals both ears Nose: Nares normal. Septum midline. Mucosa normal. No drainage or sinus tenderness. Throat: lips, mucosa, and tongue normal; teeth and gums normal Neck: no adenopathy, no carotid bruit, no JVD, supple, symmetrical, trachea midline and thyroid not enlarged, symmetric, no tenderness/mass/nodules Back: symmetric, no curvature. ROM normal. No CVA tenderness. Lungs: clear to auscultation bilaterally Breasts: normal appearance, no masses or tenderness Heart: regular rate and rhythm, S1, S2 normal, no murmur, click, rub or gallop Abdomen: soft, non-tender; bowel sounds normal; no masses,  no organomegaly Extremities: extremities normal, atraumatic, no cyanosis or edema Pulses: 2+ and symmetric Skin: Skin color, texture, turgor normal. No rashes or lesions Lymph nodes: Cervical, supraclavicular, and axillary nodes normal. Neurologic: Alert and oriented X 3, normal strength and tone. Normal symmetric reflexes. Normal coordination and gait    Assessment:    Healthy female exam.     Plan:     See After Visit Summary for Counseling Recommendations  Keep up a regular exercise program and make sure you are eating a healthy diet Try to eat 4 servings of dairy a day, or if you are lactose intolerant take a calcium with vitamin D daily.  Your vaccines are up  to date.   Will do additional labs per patient request. She does have a family history of heart disease disease I think the CRP is definitely reasonable. Next  Schedule mammogram.

## 2016-04-17 ENCOUNTER — Other Ambulatory Visit: Payer: Self-pay | Admitting: Family Medicine

## 2016-04-17 DIAGNOSIS — Z1231 Encounter for screening mammogram for malignant neoplasm of breast: Secondary | ICD-10-CM

## 2016-04-18 ENCOUNTER — Encounter: Payer: Self-pay | Admitting: Physician Assistant

## 2016-04-18 ENCOUNTER — Ambulatory Visit (INDEPENDENT_AMBULATORY_CARE_PROVIDER_SITE_OTHER): Payer: Commercial Managed Care - HMO

## 2016-04-18 DIAGNOSIS — Z1231 Encounter for screening mammogram for malignant neoplasm of breast: Secondary | ICD-10-CM

## 2016-04-18 DIAGNOSIS — E039 Hypothyroidism, unspecified: Secondary | ICD-10-CM | POA: Insufficient documentation

## 2016-04-18 LAB — LIPID PANEL
CHOL/HDL RATIO: 2.4 ratio (ref <=5.0)
Cholesterol: 227 mg/dL — ABNORMAL HIGH (ref 125–200)
HDL: 96 mg/dL (ref >=46)
LDL CALC: 113 mg/dL (ref <130)
Triglycerides: 89 mg/dL (ref <150)
VLDL: 18 mg/dL (ref <30)

## 2016-04-18 LAB — COMPLETE METABOLIC PANEL WITH GFR
ALBUMIN: 4.1 g/dL (ref 3.6–5.1)
ALK PHOS: 94 U/L (ref 33–130)
ALT: 34 U/L — ABNORMAL HIGH (ref 6–29)
AST: 24 U/L (ref 10–35)
BILIRUBIN TOTAL: 0.4 mg/dL (ref 0.2–1.2)
BUN: 16 mg/dL (ref 7–25)
CALCIUM: 8.8 mg/dL (ref 8.6–10.4)
CO2: 24 mmol/L (ref 20–31)
Chloride: 106 mmol/L (ref 98–110)
Creat: 0.98 mg/dL (ref 0.50–1.05)
GFR, EST NON AFRICAN AMERICAN: 64 mL/min (ref >=60)
GFR, Est African American: 74 mL/min (ref >=60)
GLUCOSE: 89 mg/dL (ref 65–99)
Potassium: 4.3 mmol/L (ref 3.5–5.3)
Sodium: 138 mmol/L (ref 135–146)
TOTAL PROTEIN: 6.3 g/dL (ref 6.1–8.1)

## 2016-04-18 LAB — CBC WITH DIFFERENTIAL/PLATELET
BASOS PCT: 1 %
Basophils Absolute: 43 cells/uL (ref 0–200)
EOS PCT: 7 %
Eosinophils Absolute: 301 cells/uL (ref 15–500)
HEMATOCRIT: 41.1 % (ref 35.0–45.0)
Hemoglobin: 13.9 g/dL (ref 11.7–15.5)
LYMPHS ABS: 774 {cells}/uL — AB (ref 850–3900)
LYMPHS PCT: 18 %
MCH: 31.3 pg (ref 27.0–33.0)
MCHC: 33.8 g/dL (ref 32.0–36.0)
MCV: 92.6 fL (ref 80.0–100.0)
MONO ABS: 473 {cells}/uL (ref 200–950)
MPV: 9.6 fL (ref 7.5–12.5)
Monocytes Relative: 11 %
Neutro Abs: 2709 cells/uL (ref 1500–7800)
Neutrophils Relative %: 63 %
Platelets: 278 10*3/uL (ref 140–400)
RBC: 4.44 MIL/uL (ref 3.80–5.10)
RDW: 13.3 % (ref 11.0–15.0)
WBC: 4.3 10*3/uL (ref 3.8–10.8)

## 2016-04-18 LAB — T3, FREE: T3, Free: 1.9 pg/mL — ABNORMAL LOW (ref 2.3–4.2)

## 2016-04-18 LAB — T4, FREE: FREE T4: 0.9 ng/dL (ref 0.8–1.8)

## 2016-04-18 LAB — HEMOGLOBIN A1C
HEMOGLOBIN A1C: 5.4 % (ref <5.7)
MEAN PLASMA GLUCOSE: 108 mg/dL

## 2016-04-18 LAB — C-REACTIVE PROTEIN

## 2016-04-18 LAB — VITAMIN D 25 HYDROXY (VIT D DEFICIENCY, FRACTURES): VIT D 25 HYDROXY: 41 ng/mL (ref 30–100)

## 2016-04-18 LAB — TSH: TSH: 3.54 m[IU]/L

## 2016-04-18 LAB — GAMMA GT: GGT: 10 U/L (ref 7–51)

## 2016-04-18 LAB — THYROID ANTIBODIES: Thyroperoxidase Ab SerPl-aCnc: 1 IU/mL (ref <9)

## 2016-04-20 LAB — T3, REVERSE: T3, Reverse: 11 ng/dL (ref 8–25)

## 2017-05-16 DIAGNOSIS — N951 Menopausal and female climacteric states: Secondary | ICD-10-CM | POA: Insufficient documentation

## 2018-05-02 ENCOUNTER — Ambulatory Visit: Payer: Self-pay

## 2019-03-09 ENCOUNTER — Ambulatory Visit (INDEPENDENT_AMBULATORY_CARE_PROVIDER_SITE_OTHER): Payer: 59 | Admitting: Family Medicine

## 2019-03-09 ENCOUNTER — Other Ambulatory Visit (HOSPITAL_COMMUNITY)
Admission: RE | Admit: 2019-03-09 | Discharge: 2019-03-09 | Disposition: A | Discharge status: Home / Self Care | Payer: 59 | Source: Ambulatory Visit | Attending: Family Medicine | Admitting: Family Medicine

## 2019-03-09 ENCOUNTER — Encounter: Payer: Self-pay | Admitting: Family Medicine

## 2019-03-09 VITALS — BP 122/64 | HR 68 | Ht 64.0 in | Wt 113.0 lb

## 2019-03-09 DIAGNOSIS — Z1231 Encounter for screening mammogram for malignant neoplasm of breast: Secondary | ICD-10-CM

## 2019-03-09 DIAGNOSIS — R140 Abdominal distension (gaseous): Secondary | ICD-10-CM | POA: Diagnosis not present

## 2019-03-09 DIAGNOSIS — Z0000 Encounter for general adult medical examination without abnormal findings: Secondary | ICD-10-CM | POA: Diagnosis not present

## 2019-03-09 DIAGNOSIS — Z124 Encounter for screening for malignant neoplasm of cervix: Secondary | ICD-10-CM

## 2019-03-09 DIAGNOSIS — E039 Hypothyroidism, unspecified: Secondary | ICD-10-CM

## 2019-03-09 DIAGNOSIS — Z23 Encounter for immunization: Secondary | ICD-10-CM

## 2019-03-09 MED ORDER — FLUCONAZOLE 150 MG PO TABS: 150.0000 mg | ORAL_TABLET | ORAL | 0 refills | Status: DC

## 2019-03-09 NOTE — Progress Notes (Signed)
Subjective:     Anna Pierce is a 63 y.o. female and is here for a comprehensive physical exam. The patient reports problems - GI issues. For months she has had problems with gas, belching and bloating.  She is not sure what is really triggering it.  It pretty much happens every time she eats.  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  Though she does eat goat cheese.  Though she says she is even tried to eliminate that just to see if it would help.  She even tried aluminating tomatoes.  She avoids all grains normally.  And eats a very low carb diet mostly consisting of vegetables and meat.  She just cannot quite put her finger on what is causing the symptoms.  She really has not had any significant abdominal pain or distress.  She denies any actual heartburn.  More recently she had a consult with an integrative medicine physician and so has been taking 2 probiotics in hopes that that would help.  He also felt like she may have a problem with systemic yeast.  Request some additional lab such as uric acid, thyroid, vitamin D, and CRP.  She is overdue for her Pap smear and would like to get that done today.  Social History   Socioeconomic History  . Marital status: Married    Spouse name: Randall Hiss  . Number of children: 4  . Years of education: Not on file  . Highest education level: Not on file  Occupational History  . Occupation: Psychologist, occupational    Comment: Teacher, music.   Social Needs  . Financial resource strain: Not on file  . Food insecurity    Worry: Not on file    Inability: Not on file  . Transportation needs    Medical: Not on file    Non-medical: Not on file  Tobacco Use  . Smoking status: Never Smoker  . Smokeless tobacco: Never Used  Substance and Sexual Activity  . Alcohol use: No  . Drug use: No  . Sexual activity: Yes    Partners: Male  Lifestyle  . Physical activity    Days per week: Not on file   Minutes per session: Not on file  . Stress: Not on file  Relationships  . Social Herbalist on phone: Not on file    Gets together: Not on file    Attends religious service: Not on file    Active member of club or organization: Not on file    Attends meetings of clubs or organizations: Not on file    Relationship status: Not on file  . Intimate partner violence    Fear of current or ex partner: Not on file    Emotionally abused: Not on file    Physically abused: Not on file    Forced sexual activity: Not on file  Other Topics Concern  . Not on file  Social History Narrative  . Not on file   Health Maintenance  Topic Date Due  . Hepatitis C Screening  1956-08-13  . HIV Screening  10/08/1972  . PAP SMEAR-Modifier  07/05/2017  . MAMMOGRAM  04/18/2018  . COLONOSCOPY  09/17/2018  . INFLUENZA VACCINE  04/18/2019  . TETANUS/TDAP  07/01/2023    The following portions of the patient's history were reviewed and updated as appropriate: allergies, current medications, past family history, past medical history, past social history, past surgical  history and problem list.  Review of Systems A comprehensive review of systems was negative.   Objective:    BP 122/64   Pulse 68   Ht 5\' 4"  (1.626 m)   Wt 113 lb (51.3 kg)   SpO2 100%   BMI 19.40 kg/m  General appearance: alert, cooperative and appears stated age Head: Normocephalic, without obvious abnormality, atraumatic Eyes: conj clear, EOMI, PEERLA Ears: normal TM's and external ear canals both ears Nose: Nares normal. Septum midline. Mucosa normal. No drainage or sinus tenderness. Throat: lips, mucosa, and tongue normal; teeth and gums normal Neck: no adenopathy, no carotid bruit, no JVD, supple, symmetrical, trachea midline and thyroid not enlarged, symmetric, no tenderness/mass/nodules Back: symmetric, no curvature. ROM normal. No CVA tenderness. Lungs: clear to auscultation bilaterally Breasts: normal appearance,  no masses or tenderness Heart: regular rate and rhythm, S1, S2 normal, no murmur, click, rub or gallop Abdomen: soft, non-tender; bowel sounds normal; no masses,  no organomegaly Pelvic: cervix normal in appearance, external genitalia normal, no adnexal masses or tenderness, no cervical motion tenderness, rectovaginal septum normal, uterus normal size, shape, and consistency and vagina normal without discharge Extremities: extremities normal, atraumatic, no cyanosis or edema Pulses: 2+ and symmetric Skin: Skin color, texture, turgor normal. No rashes or lesions Lymph nodes: Cervical, supraclavicular, and axillary nodes normal. Neurologic: Alert and oriented X 3, normal strength and tone. Normal symmetric reflexes. Normal coordination and gait    Assessment:    Healthy female exam.      Plan:     See After Visit Summary for Counseling Recommendations    Keep up a regular exercise program and make sure you are eating a healthy diet Try to eat 4 servings of dairy a day, or if you are lactose intolerant take a calcium with vitamin D daily.  Discussed need for shingles vaccine, Shingrix given. F/U in 2-6 months for 2nd.  Routine labs ordered. Pap smear performed today.  Gas/boat bloating/belching-unclear etiology.  Hopefully the probiotics will help.  She has had some vaginal itching symptoms consistent with yeast so I did send over prescription for Diflucan to be used if needed.  I did do the additional labs that she requested and can fax to the integrative medicine medicine specialist.  We also discussed possibly testing for food sensitivities.  Explained that is not true allergies but possible sensitivities and just checks for some more common foods.

## 2019-03-09 NOTE — Patient Instructions (Signed)

## 2019-03-09 NOTE — Progress Notes (Signed)
1st Shingrix given today. Pt to rtc in 2-6 mos for 2nd immunization .Elouise Munroe, [place]

## 2019-03-11 LAB — CYTOLOGY - PAP
Diagnosis: NEGATIVE
HPV: NOT DETECTED

## 2019-03-13 LAB — CBC WITH DIFFERENTIAL/PLATELET
Absolute Monocytes: 542 cells/uL (ref 200–950)
Basophils Absolute: 29 cells/uL (ref 0–200)
Basophils Relative: 0.6 %
Eosinophils Absolute: 202 cells/uL (ref 15–500)
Eosinophils Relative: 4.2 %
HCT: 41.9 % (ref 35.0–45.0)
Hemoglobin: 14.2 g/dL (ref 11.7–15.5)
Lymphs Abs: 845 cells/uL — ABNORMAL LOW (ref 850–3900)
MCH: 30.7 pg (ref 27.0–33.0)
MCHC: 33.9 g/dL (ref 32.0–36.0)
MCV: 90.5 fL (ref 80.0–100.0)
MPV: 9.4 fL (ref 7.5–12.5)
Monocytes Relative: 11.3 %
Neutro Abs: 3182 cells/uL (ref 1500–7800)
Neutrophils Relative %: 66.3 %
Platelets: 296 10*3/uL (ref 140–400)
RBC: 4.63 10*6/uL (ref 3.80–5.10)
RDW: 12.3 % (ref 11.0–15.0)
Total Lymphocyte: 17.6 %
WBC: 4.8 10*3/uL (ref 3.8–10.8)

## 2019-03-13 LAB — COMPLETE METABOLIC PANEL WITH GFR
AG Ratio: 2.1 (calc) (ref 1.0–2.5)
ALT: 24 U/L (ref 6–29)
AST: 23 U/L (ref 10–35)
Albumin: 4.7 g/dL (ref 3.6–5.1)
Alkaline phosphatase (APISO): 76 U/L (ref 37–153)
BUN: 17 mg/dL (ref 7–25)
CO2: 28 mmol/L (ref 20–32)
Calcium: 10.1 mg/dL (ref 8.6–10.4)
Chloride: 103 mmol/L (ref 98–110)
Creat: 0.91 mg/dL (ref 0.50–0.99)
GFR, Est African American: 79 mL/min/{1.73_m2} (ref >=60)
GFR, Est Non African American: 68 mL/min/{1.73_m2} (ref >=60)
Globulin: 2.2 g/dL (calc) (ref 1.9–3.7)
Glucose, Bld: 96 mg/dL (ref 65–99)
Potassium: 4.7 mmol/L (ref 3.5–5.3)
Sodium: 141 mmol/L (ref 135–146)
Total Bilirubin: 0.7 mg/dL (ref 0.2–1.2)
Total Protein: 6.9 g/dL (ref 6.1–8.1)

## 2019-03-13 LAB — FOOD PANEL II IGG
APPLE (F49) IGG: 7.1 ug/mL — ABNORMAL HIGH (ref <2.0)
BANANA (F92) IGG: 11.1 ug/mL — ABNORMAL HIGH (ref <2.0)
Beef, IgG: 6.5 ug/mL — ABNORMAL HIGH (ref <2.0)
CHICKEN MEAT (F83) IGG: <2 ug/mL (ref <2.0)
Cacao (Chocolate)IgG: 3.3 ug/mL — ABNORMAL HIGH (ref <2.0)
Casein IgE: 3.2 ug/mL — ABNORMAL HIGH (ref <2.0)
Corn IgG: 5.2 ug/mL — ABNORMAL HIGH (ref <2.0)
Egg white, IgG: 23.6 ug/mL — ABNORMAL HIGH (ref <2.0)
Orange IgG: 3.2 ug/mL — ABNORMAL HIGH (ref <2.0)
POTATO (F35) IGG: 2.6 ug/mL — ABNORMAL HIGH (ref <2.0)
Soybean IgG: 2.6 ug/mL — ABNORMAL HIGH (ref <2.0)
Tomato IgG: 4.3 ug/mL — ABNORMAL HIGH (ref <2.0)
Wheat IgG: 5.6 ug/mL — ABNORMAL HIGH (ref <2.0)

## 2019-03-13 LAB — THYROID PANEL WITH TSH
Free Thyroxine Index: 2.3 (ref 1.4–3.8)
T3 Uptake: 31 % (ref 22–35)
T4, Total: 7.5 ug/dL (ref 5.1–11.9)
TSH: 1.24 mIU/L (ref 0.40–4.50)

## 2019-03-13 LAB — LIPID PANEL
Cholesterol: 258 mg/dL — ABNORMAL HIGH (ref <200)
HDL: 79 mg/dL (ref >=50)
LDL Cholesterol (Calc): 160 mg/dL (calc) — ABNORMAL HIGH
Non-HDL Cholesterol (Calc): 179 mg/dL (calc) — ABNORMAL HIGH (ref <130)
Total CHOL/HDL Ratio: 3.3 (calc) (ref <5.0)
Triglycerides: 87 mg/dL (ref <150)

## 2019-03-13 LAB — THYROID PEROXIDASE ANTIBODY: Thyroperoxidase Ab SerPl-aCnc: <1 IU/mL (ref <9)

## 2019-03-13 LAB — VITAMIN D 25 HYDROXY (VIT D DEFICIENCY, FRACTURES): Vit D, 25-Hydroxy: 35 ng/mL (ref 30–100)

## 2019-03-13 LAB — T3, REVERSE: T3, Reverse: 13 ng/dL (ref 8–25)

## 2019-03-13 LAB — C-REACTIVE PROTEIN: CRP: 0.5 mg/L (ref <8.0)

## 2019-03-13 LAB — URIC ACID: Uric Acid, Serum: 4.5 mg/dL (ref 2.5–7.0)

## 2019-04-02 ENCOUNTER — Ambulatory Visit (INDEPENDENT_AMBULATORY_CARE_PROVIDER_SITE_OTHER): Payer: 59

## 2019-04-02 ENCOUNTER — Other Ambulatory Visit: Payer: Self-pay

## 2019-04-02 DIAGNOSIS — Z1231 Encounter for screening mammogram for malignant neoplasm of breast: Secondary | ICD-10-CM

## 2019-04-02 DIAGNOSIS — R140 Abdominal distension (gaseous): Secondary | ICD-10-CM | POA: Diagnosis not present

## 2019-04-02 DIAGNOSIS — Z0000 Encounter for general adult medical examination without abnormal findings: Secondary | ICD-10-CM

## 2019-05-11 ENCOUNTER — Encounter: Payer: Self-pay | Admitting: Family Medicine

## 2019-05-16 NOTE — Telephone Encounter (Signed)
OK, Anna Pierce can yo go to the website and setup account and then once we have access we can order her test.  Thank you.

## 2019-05-19 NOTE — Telephone Encounter (Signed)
Looking into creating profile/ordering tests

## 2019-06-02 ENCOUNTER — Encounter: Payer: Self-pay | Admitting: Family Medicine

## 2019-07-03 ENCOUNTER — Encounter: Payer: Self-pay | Admitting: *Deleted

## 2019-07-10 ENCOUNTER — Telehealth: Payer: Self-pay | Admitting: Family Medicine

## 2019-07-10 NOTE — Telephone Encounter (Signed)
mychart note sent

## 2019-08-24 ENCOUNTER — Encounter: Payer: Self-pay | Admitting: Family Medicine

## 2019-08-24 NOTE — Telephone Encounter (Signed)
I do not see the copy scanned into her chart.  May need to call diagnostics directly and have them refax the results.

## 2019-08-25 NOTE — Telephone Encounter (Signed)
Spoke with pt and she will send a MyChart message with the results attached.  KES,CMA

## 2020-02-18 ENCOUNTER — Encounter: Payer: Self-pay | Admitting: Family Medicine

## 2020-02-18 NOTE — Telephone Encounter (Signed)
Placed in your basket for review

## 2020-03-11 NOTE — Telephone Encounter (Signed)
Hi Merrillyn,   I looked over the information from [facility].  If I am interpreting this correctly, out of the 5 categories that they evaluated the only one that really seemed significantly out of balance was the metabolic imbalance category.  They felt that she needed premiotic support.  The recommendation was for prebiotic/probiotics.  Increase dietary fiber intake, increased fermented foods and increased resistance starches.  You had a moderate score for the dysbiosis.  The treatment recommendation was actually very similar including pre and probiotics.  Increased fiber and resistant starches as well as fermented foods.  Not seem to have any issues with inflammation or maldigestion or infection.  I am not sure if this is her company has any recommendations for a probiotic, more than what is just available over-the-counter.  I think you had also mentioned that there was someone that you were working with on these results as well and they might have a little more insight.  Hopefully you are well and hopefully this was helpful.  I also wanted to remind you that you are due for a screening colonoscopy per our records it looks like your last evaluation was in 2010 so it has been 10 years.  If you are interested in doing a colonoscopy again just let us know or if you are interested in doing an alternative such as Cologuard which is a stool test which is good for 3 years, then please let us know.  Dr. Jerilynn Mages

## 2020-04-11 DIAGNOSIS — H43813 Vitreous degeneration, bilateral: Secondary | ICD-10-CM | POA: Insufficient documentation

## 2020-04-11 DIAGNOSIS — H2513 Age-related nuclear cataract, bilateral: Secondary | ICD-10-CM | POA: Insufficient documentation

## 2020-06-06 ENCOUNTER — Encounter: Payer: Self-pay | Admitting: Nurse Practitioner

## 2020-07-03 ENCOUNTER — Other Ambulatory Visit: Payer: Self-pay

## 2020-07-03 ENCOUNTER — Emergency Department
Admission: EM | Admit: 2020-07-03 | Discharge: 2020-07-03 | Disposition: A | Discharge status: Home / Self Care | Payer: 59 | Source: Home / Self Care

## 2020-07-03 ENCOUNTER — Encounter: Payer: Self-pay | Admitting: Emergency Medicine

## 2020-07-03 DIAGNOSIS — J069 Acute upper respiratory infection, unspecified: Secondary | ICD-10-CM | POA: Diagnosis not present

## 2020-07-03 MED ORDER — DOXYCYCLINE HYCLATE 100 MG PO CAPS: 100.0000 mg | ORAL_CAPSULE | Freq: Two times a day (BID) | ORAL | 0 refills | Status: DC
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
## 2020-07-03 NOTE — ED Triage Notes (Signed)
Patient c/o sore throat x 6 days, bilateral ear discomfort, productive green cough, rapid COVID test on Thursday was negative, patient is vaccinated.  Patient is afebrile.  Patient has taken Ibuprofen, Emerg-C, Mucinex, Ziacam.

## 2020-07-03 NOTE — Discharge Instructions (Signed)

## 2020-11-08 ENCOUNTER — Ambulatory Visit (INDEPENDENT_AMBULATORY_CARE_PROVIDER_SITE_OTHER): Payer: 59 | Admitting: Family Medicine

## 2020-11-08 ENCOUNTER — Other Ambulatory Visit: Payer: Self-pay | Admitting: Family Medicine

## 2020-11-08 ENCOUNTER — Encounter: Payer: Self-pay | Admitting: Family Medicine

## 2020-11-08 VITALS — BP 115/57 | HR 65 | Ht 64.0 in | Wt 118.0 lb

## 2020-11-08 DIAGNOSIS — R142 Eructation: Secondary | ICD-10-CM | POA: Diagnosis not present

## 2020-11-08 DIAGNOSIS — R140 Abdominal distension (gaseous): Secondary | ICD-10-CM

## 2020-11-08 DIAGNOSIS — Z1231 Encounter for screening mammogram for malignant neoplasm of breast: Secondary | ICD-10-CM

## 2020-11-08 DIAGNOSIS — Z0000 Encounter for general adult medical examination without abnormal findings: Secondary | ICD-10-CM

## 2020-11-08 LAB — LIPID PANEL
Cholesterol: 191 mg/dL (ref <200)
HDL: 65 mg/dL (ref >=50)
LDL Cholesterol (Calc): 106 mg/dL (calc) — ABNORMAL HIGH
Non-HDL Cholesterol (Calc): 126 mg/dL (calc) (ref <130)
Total CHOL/HDL Ratio: 2.9 (calc) (ref <5.0)
Triglycerides: 103 mg/dL (ref <150)

## 2020-11-08 LAB — CBC
HCT: 44.5 % (ref 35.0–45.0)
Hemoglobin: 15.1 g/dL (ref 11.7–15.5)
MCH: 31 pg (ref 27.0–33.0)
MCHC: 33.9 g/dL (ref 32.0–36.0)
MCV: 91.4 fL (ref 80.0–100.0)
MPV: 9.6 fL (ref 7.5–12.5)
Platelets: 332 10*3/uL (ref 140–400)
RBC: 4.87 10*6/uL (ref 3.80–5.10)
RDW: 12 % (ref 11.0–15.0)
WBC: 5.4 10*3/uL (ref 3.8–10.8)

## 2020-11-08 LAB — COMPLETE METABOLIC PANEL WITH GFR
AG Ratio: 1.8 (calc) (ref 1.0–2.5)
ALT: 18 U/L (ref 6–29)
AST: 21 U/L (ref 10–35)
Albumin: 4.7 g/dL (ref 3.6–5.1)
Alkaline phosphatase (APISO): 92 U/L (ref 37–153)
BUN: 13 mg/dL (ref 7–25)
CO2: 28 mmol/L (ref 20–32)
Calcium: 10 mg/dL (ref 8.6–10.4)
Chloride: 104 mmol/L (ref 98–110)
Creat: 0.92 mg/dL (ref 0.50–0.99)
GFR, Est African American: 77 mL/min/{1.73_m2} (ref >=60)
GFR, Est Non African American: 66 mL/min/{1.73_m2} (ref >=60)
Globulin: 2.6 g/dL (calc) (ref 1.9–3.7)
Glucose, Bld: 87 mg/dL (ref 65–99)
Potassium: 4.7 mmol/L (ref 3.5–5.3)
Sodium: 139 mmol/L (ref 135–146)
Total Bilirubin: 0.8 mg/dL (ref 0.2–1.2)
Total Protein: 7.3 g/dL (ref 6.1–8.1)

## 2020-11-08 LAB — TSH: TSH: 1.62 mIU/L (ref 0.40–4.50)

## 2020-11-08 LAB — SEDIMENTATION RATE: Sed Rate: 2 mm/h (ref 0–30)

## 2020-11-08 NOTE — Progress Notes (Signed)
Subjective:     Anna Pierce is a 65 y.o. female and is here for a comprehensive physical exam. The patient reports problems - still having GI sxs with bloating, excess gas. Has tried low FODMAP diet.she has tried food avoidance as she does have multiple food sensitives. She has tried Probiotics.    Social History   Socioeconomic History  . Marital status: Married    Spouse name: Randall Hiss  . Number of children: 4  . Years of education: Not on file  . Highest education level: Not on file  Occupational History  . Occupation: Psychologist, occupational    Comment: Teacher, music.   Tobacco Use  . Smoking status: Never Smoker  . Smokeless tobacco: Never Used  Substance and Sexual Activity  . Alcohol use: No  . Drug use: No  . Sexual activity: Yes    Partners: Male  Other Topics Concern  . Not on file  Social History Narrative  . Not on file   Social Determinants of Health   Financial Resource Strain: Not on file  Food Insecurity: Not on file  Transportation Needs: Not on file  Physical Activity: Not on file  Stress: Not on file  Social Connections: Not on file  Intimate Partner Violence: Not on file   Health Maintenance  Topic Date Due  . Hepatitis C Screening  Never done  . HIV Screening  Never done  . COLONOSCOPY (Pts 45-56yrs Insurance coverage will need to be confirmed)  09/17/2018  . INFLUENZA VACCINE  12/15/2020 (Originally 04/17/2020)  . MAMMOGRAM  04/01/2021  . TETANUS/TDAP  07/01/2023  . PAP SMEAR-Modifier  03/08/2024  . COVID-19 Vaccine  Completed    The following portions of the patient's history were reviewed and updated as appropriate: allergies, current medications, past family history, past medical history, past social history, past surgical history and problem list.  Review of Systems A comprehensive review of systems was negative.   Objective:    BP (!) 115/57   Pulse 65   Ht 5\' 4"  (1.626 m)   Wt 118 lb (53.5 kg)   SpO2 100%   BMI 20.25 kg/m   General appearance: alert, cooperative and appears stated age Head: Normocephalic, without obvious abnormality, atraumatic Eyes: conj clear, EOMI, PEERLA Ears: normal TM's and external ear canals both ears Nose: Nares normal. Septum midline. Mucosa normal. No drainage or sinus tenderness. Throat: lips, mucosa, and tongue normal; teeth and gums normal Neck: no adenopathy, no carotid bruit, no JVD, supple, symmetrical, trachea midline and thyroid not enlarged, symmetric, no tenderness/mass/nodules Back: symmetric, no curvature. ROM normal. No CVA tenderness. Lungs: clear to auscultation bilaterally Breasts: normal appearance, no masses or tenderness Heart: regular rate and rhythm, S1, S2 normal, no murmur, click, rub or gallop Abdomen: soft, non-tender; bowel sounds normal; no masses,  no organomegaly Extremities: extremities normal, atraumatic, no cyanosis or edema Pulses: 2+ and symmetric Skin: Skin color, texture, turgor normal. No rashes or lesions Lymph nodes: Cervical, supraclavicular, and axillary nodes normal. Neurologic: Alert and oriented X 3, normal strength and tone. Normal symmetric reflexes. Normal coordination and gait    Assessment:    Healthy female exam.      Plan:     See After Visit Summary for Counseling Recommendations   Keep up a regular exercise program and make sure you are eating a healthy diet Try to eat 4 servings of dairy a day, or if you are lactose intolerant take a calcium with vitamin D daily.  Your vaccines are  up to date.  Encouraged her to schedule her mammogram.  Bloating/Gas-discussed GI referral to Dr. Bryan Lemma.  Plus she is due for her 10-year colonoscopy.

## 2020-11-08 NOTE — Patient Instructions (Signed)
Health Maintenance, Female Adopting a healthy lifestyle and getting preventive care are important in promoting health and wellness. Ask your health care provider about:  The right schedule for you to have regular tests and exams.  Things you can do on your own to prevent diseases and keep yourself healthy. What should I know about diet, weight, and exercise? Eat a healthy diet  Eat a diet that includes plenty of vegetables, fruits, low-fat dairy products, and lean protein.  Do not eat a lot of foods that are high in solid fats, added sugars, or sodium.   Maintain a healthy weight Body mass index (BMI) is used to identify weight problems. It estimates body fat based on height and weight. Your health care provider can help determine your BMI and help you achieve or maintain a healthy weight. Get regular exercise Get regular exercise. This is one of the most important things you can do for your health. Most adults should:  Exercise for at least 150 minutes each week. The exercise should increase your heart rate and make you sweat (moderate-intensity exercise).  Do strengthening exercises at least twice a week. This is in addition to the moderate-intensity exercise.  Spend less time sitting. Even light physical activity can be beneficial. Watch cholesterol and blood lipids Have your blood tested for lipids and cholesterol at 63 years of age, then have this test every 5 years. Have your cholesterol levels checked more often if:  Your lipid or cholesterol levels are high.  You are older than 63 years of age.  You are at high risk for heart disease. What should I know about cancer screening? Depending on your health history and family history, you may need to have cancer screening at various ages. This may include screening for:  Breast cancer.  Cervical cancer.  Colorectal cancer.  Skin cancer.  Lung cancer. What should I know about heart disease, diabetes, and high blood  pressure? Blood pressure and heart disease  High blood pressure causes heart disease and increases the risk of stroke. This is more likely to develop in people who have high blood pressure readings, are of African descent, or are overweight.  Have your blood pressure checked: ? Every 3-5 years if you are 18-39 years of age. ? Every year if you are 40 years old or older. Diabetes Have regular diabetes screenings. This checks your fasting blood sugar level. Have the screening done:  Once every three years after age 40 if you are at a normal weight and have a low risk for diabetes.  More often and at a younger age if you are overweight or have a high risk for diabetes. What should I know about preventing infection? Hepatitis B If you have a higher risk for hepatitis B, you should be screened for this virus. Talk with your health care provider to find out if you are at risk for hepatitis B infection. Hepatitis C Testing is recommended for:  Everyone born from 1945 through 1965.  Anyone with known risk factors for hepatitis C. Sexually transmitted infections (STIs)  Get screened for STIs, including gonorrhea and chlamydia, if: ? You are sexually active and are younger than 63 years of age. ? You are older than 63 years of age and your health care provider tells you that you are at risk for this type of infection. ? Your sexual activity has changed since you were last screened, and you are at increased risk for chlamydia or gonorrhea. Ask your health care provider   if you are at risk.  Ask your health care provider about whether you are at high risk for HIV. Your health care provider may recommend a prescription medicine to help prevent HIV infection. If you choose to take medicine to prevent HIV, you should first get tested for HIV. You should then be tested every 3 months for as long as you are taking the medicine. Pregnancy  If you are about to stop having your period (premenopausal) and  you may become pregnant, seek counseling before you get pregnant.  Take 400 to 800 micrograms (mcg) of folic acid every day if you become pregnant.  Ask for birth control (contraception) if you want to prevent pregnancy. Osteoporosis and menopause Osteoporosis is a disease in which the bones lose minerals and strength with aging. This can result in bone fractures. If you are 65 years old or older, or if you are at risk for osteoporosis and fractures, ask your health care provider if you should:  Be screened for bone loss.  Take a calcium or vitamin D supplement to lower your risk of fractures.  Be given hormone replacement therapy (HRT) to treat symptoms of menopause. Follow these instructions at home: Lifestyle  Do not use any products that contain nicotine or tobacco, such as cigarettes, e-cigarettes, and chewing tobacco. If you need help quitting, ask your health care provider.  Do not use street drugs.  Do not share needles.  Ask your health care provider for help if you need support or information about quitting drugs. Alcohol use  Do not drink alcohol if: ? Your health care provider tells you not to drink. ? You are pregnant, may be pregnant, or are planning to become pregnant.  If you drink alcohol: ? Limit how much you use to 0-1 drink a day. ? Limit intake if you are breastfeeding.  Be aware of how much alcohol is in your drink. In the [place] one drink equals one 12 oz bottle of beer (355 mL), one 5 oz glass of wine (148 mL), or one 1 oz glass of hard liquor (44 mL). General instructions  Schedule regular health, dental, and eye exams.  Stay current with your vaccines.  Tell your health care provider if: ? You often feel depressed. ? You have ever been abused or do not feel safe at home. Summary  Adopting a healthy lifestyle and getting preventive care are important in promoting health and wellness.  Follow your health care provider's instructions about healthy  diet, exercising, and getting tested or screened for diseases.  Follow your health care provider's instructions on monitoring your cholesterol and blood pressure. This information is not intended to replace advice given to you by your health care provider. Make sure you discuss any questions you have with your health care provider. Document Revised: 08/27/2018 Document Reviewed: 08/27/2018 Elsevier Patient Education  2021 Elsevier Inc.  

## 2020-11-09 ENCOUNTER — Encounter: Payer: Self-pay | Admitting: Gastroenterology

## 2020-11-17 ENCOUNTER — Ambulatory Visit: Payer: 59

## 2020-11-24 ENCOUNTER — Ambulatory Visit: Payer: 59

## 2020-11-24 ENCOUNTER — Other Ambulatory Visit: Payer: Self-pay

## 2020-11-24 ENCOUNTER — Ambulatory Visit (INDEPENDENT_AMBULATORY_CARE_PROVIDER_SITE_OTHER): Payer: 59

## 2020-11-24 DIAGNOSIS — Z1231 Encounter for screening mammogram for malignant neoplasm of breast: Secondary | ICD-10-CM | POA: Diagnosis not present

## 2020-11-29 ENCOUNTER — Other Ambulatory Visit: Payer: Self-pay

## 2020-11-29 ENCOUNTER — Ambulatory Visit (INDEPENDENT_AMBULATORY_CARE_PROVIDER_SITE_OTHER): Payer: 59 | Admitting: Gastroenterology

## 2020-11-29 ENCOUNTER — Other Ambulatory Visit (INDEPENDENT_AMBULATORY_CARE_PROVIDER_SITE_OTHER): Payer: 59

## 2020-11-29 ENCOUNTER — Encounter: Payer: Self-pay | Admitting: Gastroenterology

## 2020-11-29 VITALS — BP 106/64 | HR 62 | Ht 64.0 in | Wt 117.5 lb

## 2020-11-29 DIAGNOSIS — R140 Abdominal distension (gaseous): Secondary | ICD-10-CM | POA: Diagnosis not present

## 2020-11-29 DIAGNOSIS — R142 Eructation: Secondary | ICD-10-CM

## 2020-11-29 DIAGNOSIS — Z1211 Encounter for screening for malignant neoplasm of colon: Secondary | ICD-10-CM | POA: Diagnosis not present

## 2020-11-29 DIAGNOSIS — R635 Abnormal weight gain: Secondary | ICD-10-CM

## 2020-11-29 MED ORDER — RIFAXIMIN 550 MG PO TABS: 550.0000 mg | ORAL_TABLET | Freq: Three times a day (TID) | ORAL | 0 refills | Status: DC

## 2020-11-29 MED ORDER — CLENPIQ 10-3.5-12 MG-GM -GM/160ML PO SOLN: 1.0000 | Freq: Once | ORAL | 0 refills | Status: AC
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
## 2020-11-29 NOTE — Patient Instructions (Signed)
If you are age 62 or older, your body mass index should be between 23-30. Your Body mass index is 20.17 kg/m. If this is out of the aforementioned range listed, please consider follow up with your Primary Care Provider.  If you are age 81 or younger, your body mass index should be between 19-25. Your Body mass index is 20.17 kg/m. If this is out of the aformentioned range listed, please consider follow up with your Primary Care Provider.   We have sent the following medications to your pharmacy for you to pick up at your convenience: Rifaximin   Please purchase the following medications over the counter and take as directed: Align for 6 weeks.   You have been scheduled for an endoscopy and colonoscopy. Please follow the written instructions given to you at your visit today. Please pick up your prep supplies at the pharmacy within the next 1-3 days. If you use inhalers (even only as needed), please bring them with you on the day of your procedure.   Please go to the lab on the 2nd floor [address] before you leave the office today.   Follow up in 3-6 months.   It was a pleasure to see you today!  Vito Cirigliano, D.O.

## 2020-11-30 ENCOUNTER — Telehealth: Payer: Self-pay | Admitting: General Surgery

## 2020-11-30 LAB — B12 AND FOLATE PANEL
Folate: >20 ng/mL (ref >3.0)
Vitamin B-12: 1002 pg/mL (ref 232–1245)

## 2020-11-30 LAB — IBC + FERRITIN
Ferritin: 65.3 ng/mL (ref 10.0–291.0)
Iron: 145 ug/dL (ref 42–145)
Saturation Ratios: 43.5 % (ref 20.0–50.0)
Transferrin: 238 mg/dL (ref 212.0–360.0)

## 2020-11-30 LAB — VITAMIN D 25 HYDROXY (VIT D DEFICIENCY, FRACTURES): Vit D, 25-Hydroxy: 26.6 ng/mL — ABNORMAL LOW (ref 30.0–100.0)

## 2020-11-30 NOTE — Telephone Encounter (Signed)
Called the patient and explained to her she has a vitamin D deficiency and we would like her to take 50, 000 units weekly, and retest in 3 months. The patient stated, I have to much going on and I am not going to take this medication. I expressed to her if she changed her mind to please contact me back and I will send it to her pharmacy.

## 2020-11-30 NOTE — Telephone Encounter (Signed)
-----   Message from [place], DO sent at 11/30/2020  1:01 PM EDT ----- Labs notable for mild vitamin D insufficiency (26.6), with otherwise normal iron panel, B12, and folate.  Plan for the following:  Vitamin D deficiency treatment: - Start Ergocalciferol 50,000 units weekly.  Take 1 tablet every week for 8 weeks, #8, RF0 - Following completion of ergocalciferol, start vitamin D daily supplement 2000 IU daily - Recheck vitamin D in 3 months

## 2020-12-01 ENCOUNTER — Telehealth: Payer: Self-pay | Admitting: Gastroenterology

## 2020-12-01 ENCOUNTER — Other Ambulatory Visit: Payer: Self-pay | Admitting: General Surgery

## 2020-12-01 MED ORDER — RIFAXIMIN 550 MG PO TABS: 550.0000 mg | ORAL_TABLET | Freq: Three times a day (TID) | ORAL | 0 refills | Status: DC

## 2020-12-01 NOTE — Telephone Encounter (Signed)
Inbound call from patient stating rifaximin medication is over $4000 and is requesting a call back please since she found it cheaper at an alternate location.

## 2020-12-01 NOTE — Telephone Encounter (Signed)
Patients insurance denied, does not meet their criteria for Xifaxin, Printed an rx for the patient per her request.

## 2020-12-01 NOTE — Telephone Encounter (Signed)
Spoke with the patient and explained that I have sent in for a prior authorization for xifaxin. The patient stated if they do not cover it she has found another location and will need a written rx. I told the patient to call back tomorrow by 3pm if she has not heard from me.

## 2020-12-01 NOTE — Progress Notes (Signed)
Patient requested printed rx for Xifaxin if not covered by insurance.

## 2020-12-02 NOTE — Telephone Encounter (Signed)
Patient requested the rx for Xifaxin  be emailed to her at [email or web address]. Sent rx via email.

## 2020-12-09 NOTE — Telephone Encounter (Signed)
[employer] from [employer] called to provide: Key [id number] to access the portal to enter additional information.

## 2021-01-12 ENCOUNTER — Encounter: Payer: Self-pay | Admitting: Gastroenterology

## 2021-01-12 ENCOUNTER — Ambulatory Visit (AMBULATORY_SURGERY_CENTER): Payer: 59 | Admitting: Gastroenterology

## 2021-01-12 ENCOUNTER — Other Ambulatory Visit: Payer: Self-pay

## 2021-01-12 VITALS — BP 114/73 | HR 60 | Temp 97.1°F | Resp 30 | Ht 64.0 in | Wt 117.0 lb

## 2021-01-12 DIAGNOSIS — D124 Benign neoplasm of descending colon: Secondary | ICD-10-CM

## 2021-01-12 DIAGNOSIS — R142 Eructation: Secondary | ICD-10-CM

## 2021-01-12 DIAGNOSIS — K2980 Duodenitis without bleeding: Secondary | ICD-10-CM | POA: Diagnosis not present

## 2021-01-12 DIAGNOSIS — K621 Rectal polyp: Secondary | ICD-10-CM | POA: Diagnosis not present

## 2021-01-12 DIAGNOSIS — K259 Gastric ulcer, unspecified as acute or chronic, without hemorrhage or perforation: Secondary | ICD-10-CM | POA: Diagnosis not present

## 2021-01-12 DIAGNOSIS — D128 Benign neoplasm of rectum: Secondary | ICD-10-CM

## 2021-01-12 DIAGNOSIS — D125 Benign neoplasm of sigmoid colon: Secondary | ICD-10-CM

## 2021-01-12 DIAGNOSIS — Z1211 Encounter for screening for malignant neoplasm of colon: Secondary | ICD-10-CM | POA: Diagnosis not present

## 2021-01-12 DIAGNOSIS — R140 Abdominal distension (gaseous): Secondary | ICD-10-CM

## 2021-01-12 DIAGNOSIS — K635 Polyp of colon: Secondary | ICD-10-CM

## 2021-01-12 DIAGNOSIS — K2960 Other gastritis without bleeding: Secondary | ICD-10-CM

## 2021-01-12 MED ORDER — OMEPRAZOLE 20 MG PO CPDR: DELAYED_RELEASE_CAPSULE | ORAL | 0 refills | Status: DC

## 2021-01-12 MED ORDER — SODIUM CHLORIDE 0.9 % IV SOLN: 500.0000 mL | Freq: Once | INTRAVENOUS | Status: DC

## 2021-01-12 NOTE — Progress Notes (Signed)
Check-in-JB  Vital Signs-NS

## 2021-01-12 NOTE — Progress Notes (Signed)
Called to room to assist during endoscopic procedure.  Patient ID and intended procedure confirmed with present staff. Received instructions for my participation in the procedure from the performing physician.  

## 2021-01-12 NOTE — Op Note (Signed)
[place] Patient Name: Anna Pierce Procedure Date: 01/12/2021 10:59 AM MRN: [id number] Endoscopist: Gerrit Heck , MD Age: 65 Referring MD:  Date of Birth: Nov 17, 1955 Gender: Female Account #: [id number] Procedure:                Colonoscopy Indications:              Screening for colorectal malignant neoplasm (last                            colonoscopy was more than 10 years ago) Medicines:                Monitored Anesthesia Care Procedure:                Pre-Anesthesia Assessment:                           - Prior to the procedure, a History and Physical                            was performed, and patient medications and                            allergies were reviewed. The patient's tolerance of                            previous anesthesia was also reviewed. The risks                            and benefits of the procedure and the sedation                            options and risks were discussed with the patient.                            All questions were answered, and informed consent                            was obtained. Prior Anticoagulants: The patient has                            taken no previous anticoagulant or antiplatelet                            agents. ASA Grade Assessment: I - A normal, healthy                            patient. After reviewing the risks and benefits,                            the patient was deemed in satisfactory condition to                            undergo the procedure.  After obtaining informed consent, the colonoscope                            was passed under direct vision. Throughout the                            procedure, the patient's blood pressure, pulse, and                            oxygen saturations were monitored continuously. The                            Olympus CF-HQ190 [id number]) Colonoscope was                            introduced through the anus and  advanced to the the                            terminal ileum. The colonoscopy was performed                            without difficulty. The patient tolerated the                            procedure well. The quality of the bowel                            preparation was good. The terminal ileum, ileocecal                            valve, appendiceal orifice, and rectum were                            photographed. Scope In: 11:13:49 AM Scope Out: 11:31:54 AM Scope Withdrawal Time: 0 hours 12 minutes 57 seconds  Total Procedure Duration: 0 hours 18 minutes 5 seconds  Findings:                 The perianal and digital rectal examinations were                            normal.                           Two sessile polyps were found in the sigmoid colon                            and descending colon. The polyps were 2 to 3 mm in                            size. These polyps were removed with a cold biopsy                            forceps. Resection and retrieval were complete.  Estimated blood loss was minimal.                           Two sessile polyps were found in the rectum. The                            polyps were 2 to 3 mm in size. These polyps were                            removed with a cold biopsy forceps. Resection and                            retrieval were complete. Estimated blood loss was                            minimal.                           The retroflexed view of the distal rectum and anal                            verge was normal and showed no anal or rectal                            abnormalities.                           The terminal ileum appeared normal. Complications:            No immediate complications. Estimated Blood Loss:     Estimated blood loss was minimal. Impression:               - Two 2 to 3 mm polyps in the sigmoid colon and in                            the descending colon, removed with a cold  biopsy                            forceps. Resected and retrieved.                           - Two 2 to 3 mm polyps in the rectum, removed with                            a cold biopsy forceps. Resected and retrieved.                           - The distal rectum and anal verge are normal on                            retroflexion view.                           - The examined portion of the ileum was normal. Recommendation:           -  Patient has a contact number available for                            emergencies. The signs and symptoms of potential                            delayed complications were discussed with the                            patient. Return to normal activities tomorrow.                            Written discharge instructions were provided to the                            patient.                           - Resume previous diet.                           - Continue present medications.                           - Await pathology results.                           - Repeat colonoscopy for surveillance based on                            pathology results.                           - Return to GI office PRN. Gerrit Heck, MD 01/12/2021 11:38:49 AM

## 2021-01-12 NOTE — Op Note (Signed)
[place] Patient Name: Anna Pierce Procedure Date: 01/12/2021 11:00 AM MRN: [id number] Endoscopist: Gerrit Heck , MD Age: 65 Referring MD:  Date of Birth: 1955-11-24 Gender: Female Account #: [id number] Procedure:                Upper GI endoscopy Indications:              Abdominal bloating, Eructation Medicines:                Monitored Anesthesia Care Procedure:                Pre-Anesthesia Assessment:                           - Prior to the procedure, a History and Physical                            was performed, and patient medications and                            allergies were reviewed. The patient's tolerance of                            previous anesthesia was also reviewed. The risks                            and benefits of the procedure and the sedation                            options and risks were discussed with the patient.                            All questions were answered, and informed consent                            was obtained. Prior Anticoagulants: The patient has                            taken no previous anticoagulant or antiplatelet                            agents. ASA Grade Assessment: I - A normal, healthy                            patient. After reviewing the risks and benefits,                            the patient was deemed in satisfactory condition to                            undergo the procedure.                           After obtaining informed consent, the endoscope was  passed under direct vision. Throughout the                            procedure, the patient's blood pressure, pulse, and                            oxygen saturations were monitored continuously. The                            Endoscope was introduced through the mouth, and                            advanced to the second part of duodenum. The upper                            GI endoscopy was accomplished  without difficulty.                            The patient tolerated the procedure well. Scope In: Scope Out: Findings:                 The examined esophagus was normal.                           The Z-line was regular and was found 39 cm from the                            incisors.                           One non-bleeding, healing, superficial gastric                            ulcer with no stigmata of bleeding was found in the                            gastric antrum. The lesion was 3 mm in largest                            dimension. Biopsies were taken with a cold forceps                            for histology. Estimated blood loss was minimal.                           The cardia, gastric fundus, gastric body and                            pylorus were normal.                           The examined duodenum was normal. Biopsies for                            histology were taken  with a cold forceps for                            evaluation of celiac disease. Estimated blood loss                            was minimal. Complications:            No immediate complications. Estimated Blood Loss:     Estimated blood loss was minimal. Impression:               - Normal esophagus.                           - Z-line regular, 39 cm from the incisors.                           - Non-bleeding gastric ulcer with no stigmata of                            bleeding. Biopsied.                           - Normal cardia, gastric fundus, gastric body and                            pylorus.                           - Normal examined duodenum. Biopsied. Recommendation:           - Patient has a contact number available for                            emergencies. The signs and symptoms of potential                            delayed complications were discussed with the                            patient. Return to normal activities tomorrow.                            Written discharge  instructions were provided to the                            patient.                           - Resume previous diet.                           - Continue present medications.                           - Await pathology results.                           -  Use Prilosec (omeprazole) 20 mg PO BID for 6                            weeks to promote ulcer healing, then reduce to 20                            mg daily and discontinue. Gerrit Heck, MD 01/12/2021 11:35:56 AM

## 2021-01-12 NOTE — Progress Notes (Signed)
Report to PACU, RN, vss, BBS= Clear.  

## 2021-01-12 NOTE — Patient Instructions (Signed)
Handouts given for polyps and peptic ulcer.  Await pathology results.  Pick up new prescription today.  YOU HAD AN ENDOSCOPIC PROCEDURE TODAY AT [facility]:   Refer to the procedure report that was given to you for any specific questions about what was found during the examination.  If the procedure report does not answer your questions, please call your gastroenterologist to clarify.  If you requested that your care partner not be given the details of your procedure findings, then the procedure report has been included in a sealed envelope for you to review at your convenience later.  YOU SHOULD EXPECT: Some feelings of bloating in the abdomen. Passage of more gas than usual.  Walking can help get rid of the air that was put into your GI tract during the procedure and reduce the bloating. If you had a lower endoscopy (such as a colonoscopy or flexible sigmoidoscopy) you may notice spotting of blood in your stool or on the toilet paper. If you underwent a bowel prep for your procedure, you may not have a normal bowel movement for a few days.  Please Note:  You might notice some irritation and congestion in your nose or some drainage.  This is from the oxygen used during your procedure.  There is no need for concern and it should clear up in a day or so.  SYMPTOMS TO REPORT IMMEDIATELY:   Following lower endoscopy (colonoscopy or flexible sigmoidoscopy):  Excessive amounts of blood in the stool  Significant tenderness or worsening of abdominal pains  Swelling of the abdomen that is new, acute  Fever of 100F or higher   Following upper endoscopy (EGD)  Vomiting of blood or coffee ground material  New chest pain or pain under the shoulder blades  Painful or persistently difficult swallowing  New shortness of breath  Fever of 100F or higher  Black, tarry-looking stools  For urgent or emergent issues, a gastroenterologist can be reached at any hour by calling [phone]  [phone]. Do not use MyChart messaging for urgent concerns.    DIET:  We do recommend a small meal at first, but then you may proceed to your regular diet.  Drink plenty of fluids but you should avoid alcoholic beverages for 24 hours.  ACTIVITY:  You should plan to take it easy for the rest of today and you should NOT DRIVE or use heavy machinery until tomorrow (because of the sedation medicines used during the test).    FOLLOW UP: Our staff will call the number listed on your records 48-72 hours following your procedure to check on you and address any questions or concerns that you may have regarding the information given to you following your procedure. If we do not reach you, we will leave a message.  We will attempt to reach you two times.  During this call, we will ask if you have developed any symptoms of COVID 19. If you develop any symptoms (ie: fever, flu-like symptoms, shortness of breath, cough etc.) before then, please call [phone].  If you test positive for Covid 19 in the 2 weeks post procedure, please call and report this information to [place].    If any biopsies were taken you will be contacted by phone or by letter within the next 1-3 weeks.  Please call us at [phone] if you have not heard about the biopsies in 3 weeks.    SIGNATURES/CONFIDENTIALITY: You and/or your care partner have signed paperwork which will be entered  into your electronic medical record.  These signatures attest to the fact that that the information above on your After Visit Summary has been reviewed and is understood.  Full responsibility of the confidentiality of this discharge information lies with you and/or your care-partner.

## 2021-01-16 ENCOUNTER — Telehealth: Payer: Self-pay | Admitting: Gastroenterology

## 2021-01-16 ENCOUNTER — Other Ambulatory Visit: Payer: Self-pay | Admitting: General Surgery

## 2021-01-16 ENCOUNTER — Telehealth: Payer: Self-pay | Admitting: *Deleted

## 2021-01-16 MED ORDER — RIFAXIMIN 550 MG PO TABS: 550.0000 mg | ORAL_TABLET | Freq: Three times a day (TID) | ORAL | 0 refills | Status: AC

## 2021-01-16 MED ORDER — RIFAXIMIN 550 MG PO TABS: 550.0000 mg | ORAL_TABLET | Freq: Three times a day (TID) | ORAL | 0 refills | Status: DC

## 2021-01-16 NOTE — Progress Notes (Signed)
Spoke with Danae Chen at [facility], medication can be e-scribed to them instead of printed.sent medication to [facility] verified with Danae Chen. She pulled the patients record to verify which pharmacy address. Notified the patient it was sent will be ready to fill in 24-48 hours

## 2021-01-16 NOTE — Telephone Encounter (Signed)
  Follow up Call-  Call back number 01/12/2021  Post procedure Call Back phone  # [phone]  Permission to leave phone message Yes  Some recent data might be hidden     Patient questions:  Do you have a fever, pain , or abdominal swelling? No. Pain Score  0 *  Have you tolerated food without any problems? Yes.    Have you been able to return to your normal activities? Yes.    Do you have any questions about your discharge instructions: Diet   No. Medications  No. Follow up visit  No.  Do you have questions or concerns about your Care? No.  Actions: * If pain score is 4 or above: No action needed, pain <4.  1. Have you developed a fever since your procedure? no  2.   Have you had an respiratory symptoms (SOB or cough) since your procedure? no  3.   Have you tested positive for COVID 19 since your procedure no  4.   Have you had any family members/close contacts diagnosed with the COVID 19 since your procedure?  no   If yes to any of these questions please route to Joylene John, RN and Joella Prince, RN

## 2021-01-26 ENCOUNTER — Encounter: Payer: Self-pay | Admitting: Gastroenterology

## 2022-10-25 DIAGNOSIS — K080 Exfoliation of teeth due to systemic causes: Secondary | ICD-10-CM | POA: Diagnosis not present

## 2022-11-27 IMAGING — MG MM DIGITAL SCREENING BILAT W/ TOMO AND CAD
8 series · 9 of 24 positions shown · non-contrast
Comparison: Previous exam(s).

CLINICAL DATA: Screening.

EXAM:
DIGITAL SCREENING BILATERAL MAMMOGRAM WITH TOMOSYNTHESIS AND CAD
TECHNIQUE: Bilateral screening digital craniocaudal and mediolateral oblique
mammograms were obtained. Bilateral screening digital breast
tomosynthesis was performed. The images were evaluated with
computer-aided detection.

[R CC synth-2D]
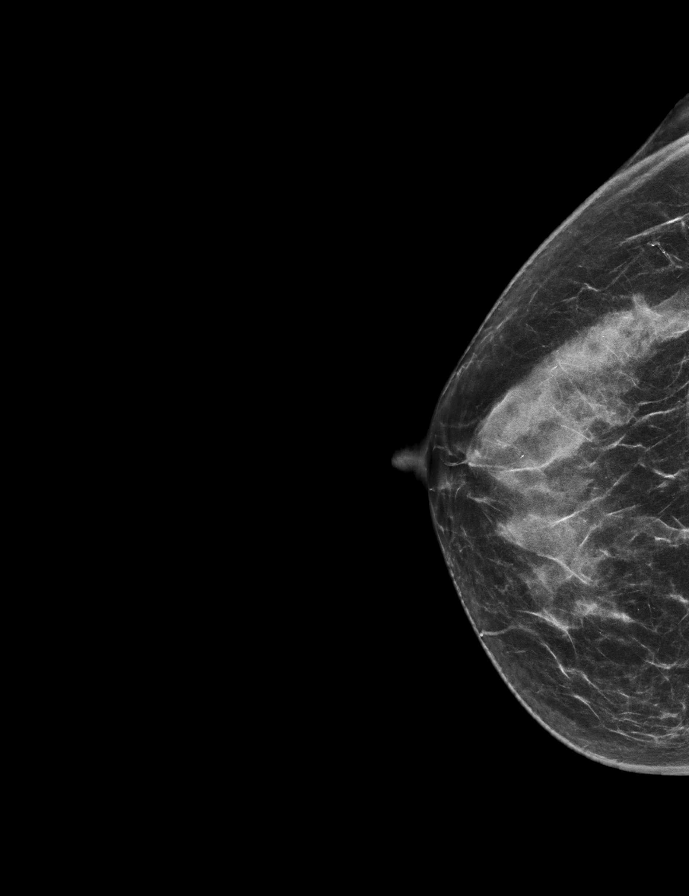

[L MLO synth-2D]
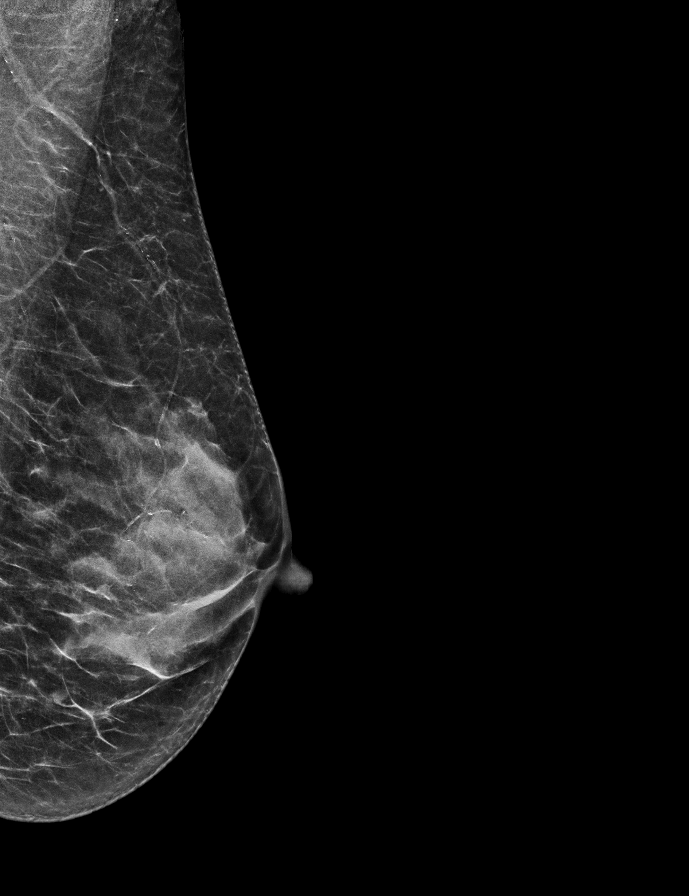

[R MLO synth-2D]
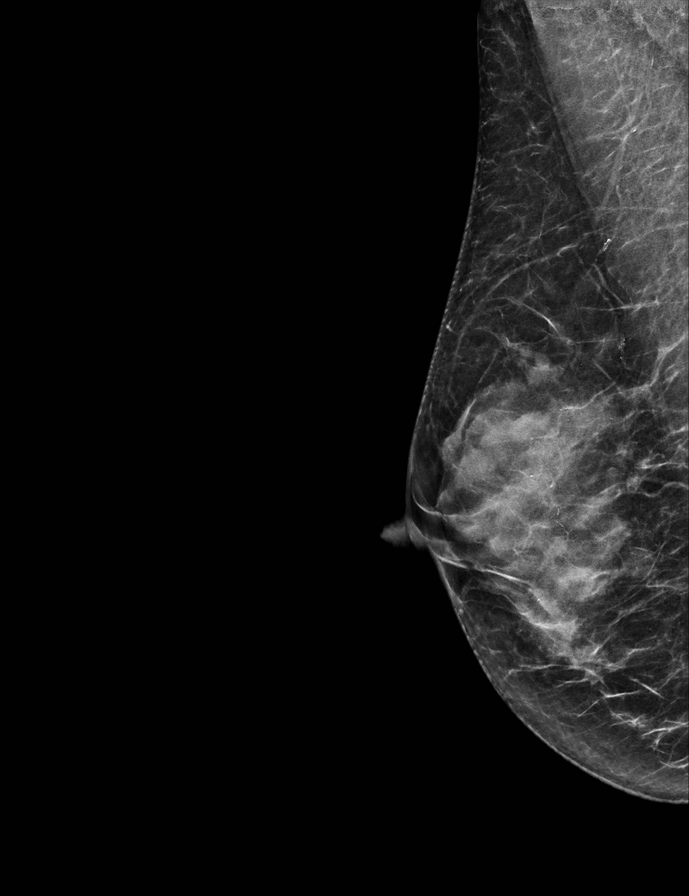

[L CC synth-2D]
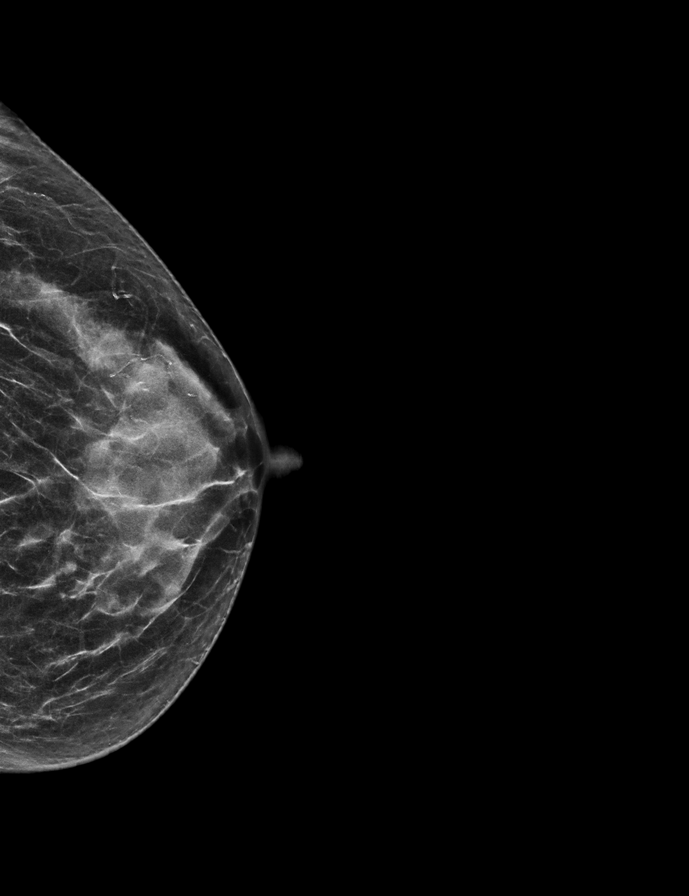

[R MLO tomo · 2 of 54 frames shown]
[frame 18/54]
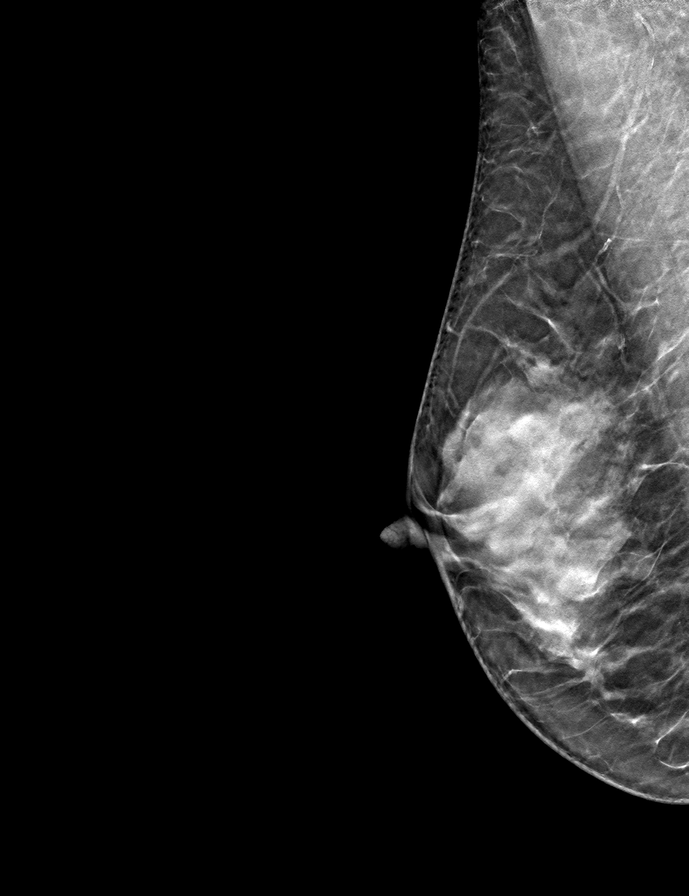
[frame 27/54]
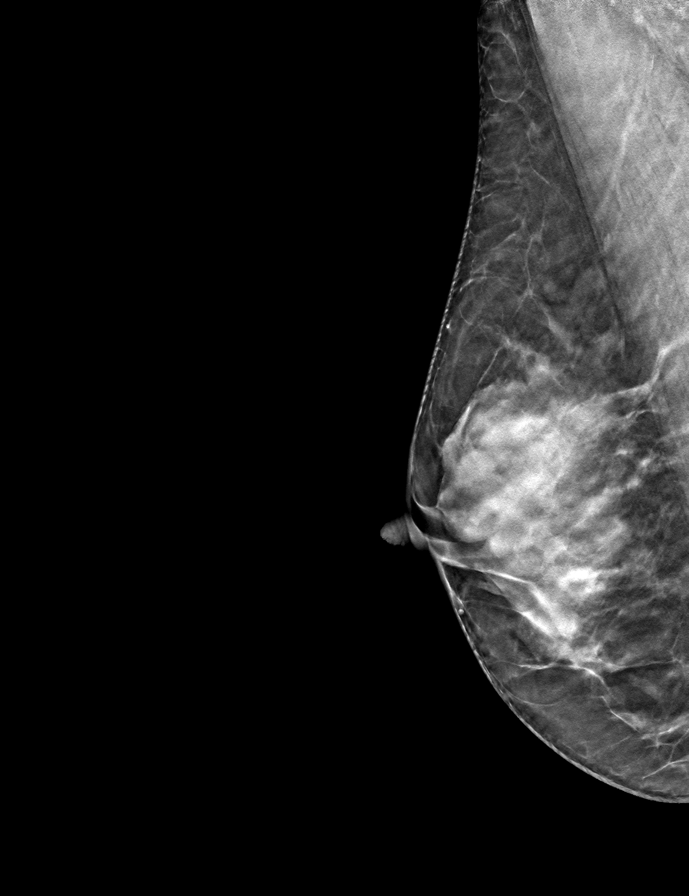

[R CC tomo · tomo slice 30/59.0]
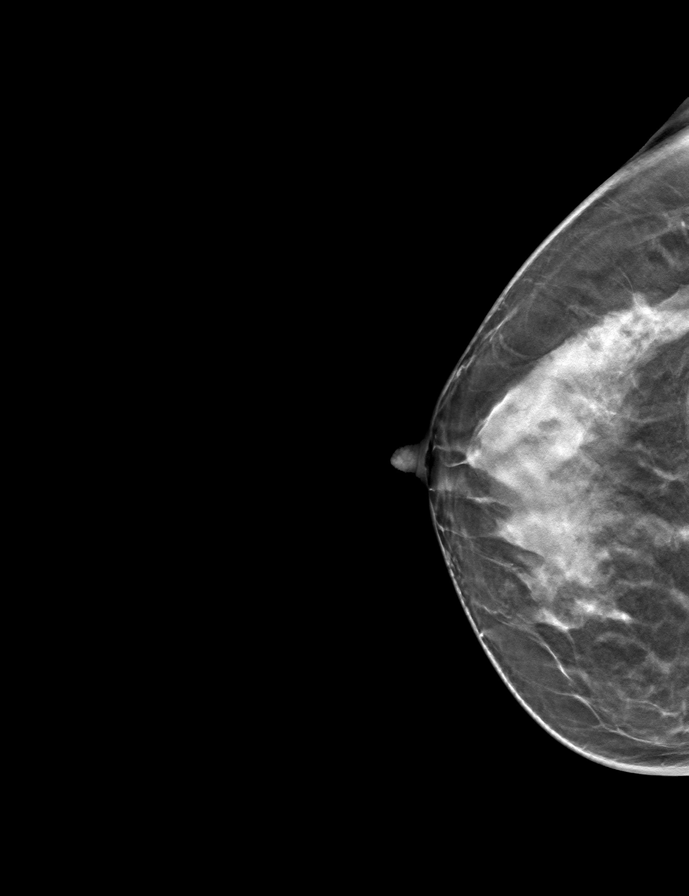

[L CC tomo · tomo slice 28/55.0]
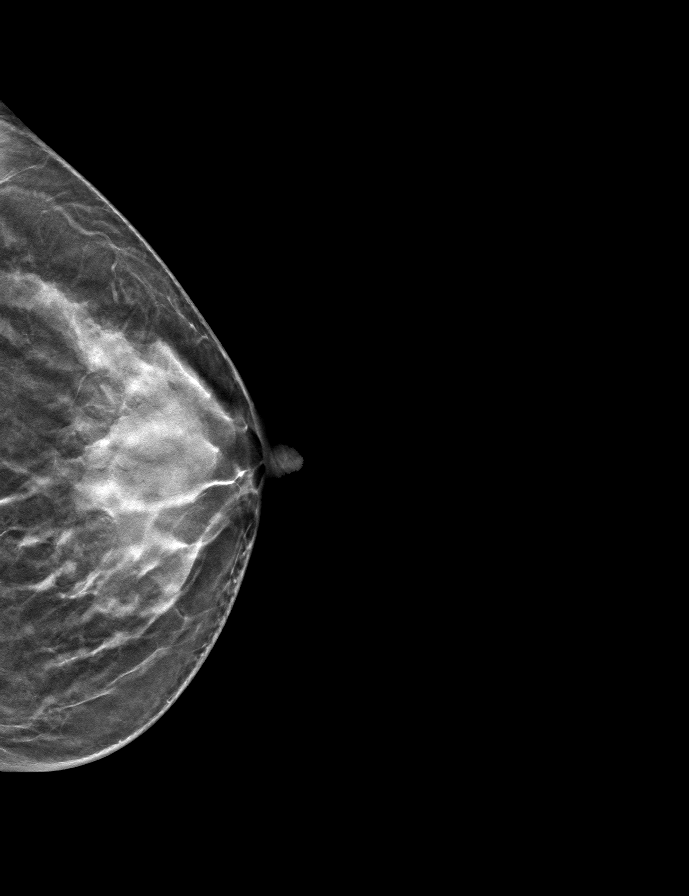

[L MLO tomo · tomo slice 25/50.0]
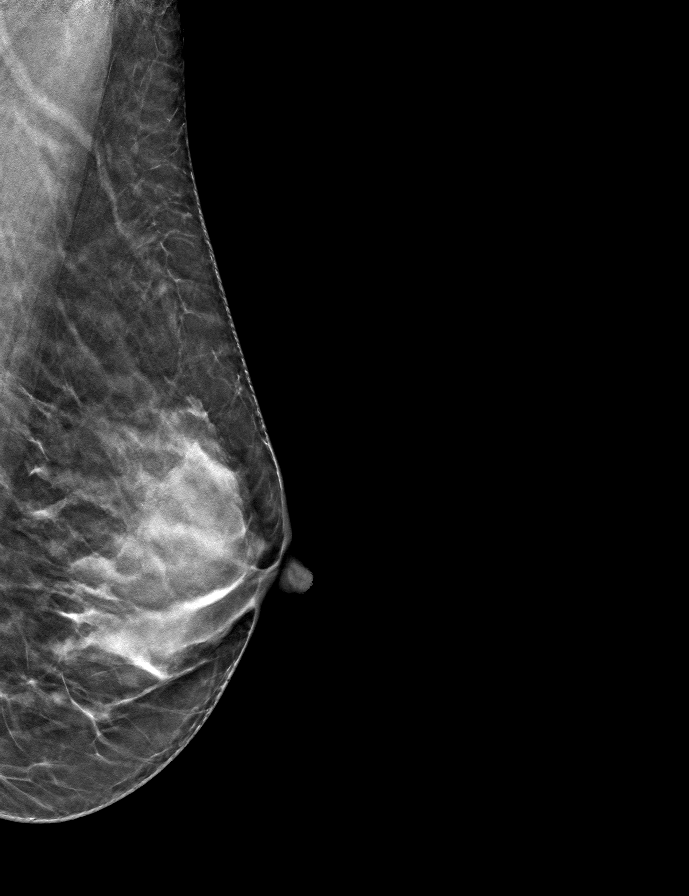

[9 of 24 positions shown; findings below may reference images not displayed]

ACR Breast Density Category d: The breast tissue is extremely dense,
which lowers the sensitivity of mammography
FINDINGS: There are no findings suspicious for malignancy. The images were
evaluated with computer-aided detection.
IMPRESSION: No mammographic evidence of malignancy. A result letter of this
screening mammogram will be mailed directly to the patient.

RECOMMENDATION:
Screening mammogram in one year. (Code:95-0-E9V)

BI-RADS CATEGORY  1: Negative.

## 2022-12-20 ENCOUNTER — Telehealth: Payer: Self-pay | Admitting: Family Medicine

## 2022-12-20 DIAGNOSIS — Z0000 Encounter for general adult medical examination without abnormal findings: Secondary | ICD-10-CM

## 2022-12-20 DIAGNOSIS — N951 Menopausal and female climacteric states: Secondary | ICD-10-CM

## 2022-12-20 DIAGNOSIS — Z1231 Encounter for screening mammogram for malignant neoplasm of breast: Secondary | ICD-10-CM

## 2022-12-20 DIAGNOSIS — Z13220 Encounter for screening for lipoid disorders: Secondary | ICD-10-CM

## 2022-12-20 DIAGNOSIS — E039 Hypothyroidism, unspecified: Secondary | ICD-10-CM

## 2022-12-20 NOTE — Telephone Encounter (Signed)
Orders Placed This Encounter  Procedures   MM 3D SCREENING MAMMOGRAM BILATERAL BREAST    Order Specific Question:   Reason for Exam (SYMPTOM  OR DIAGNOSIS REQUIRED)    Answer:   screening mammogram    Order Specific Question:   Preferred imaging location?    Answer:   [facility]   DG Bone Density    Standing Status:   Future    Standing Expiration Date:   12/20/2023    Order Specific Question:   Reason for Exam (SYMPTOM  OR DIAGNOSIS REQUIRED)    Answer:   menopausal    Order Specific Question:   Preferred imaging location?    Answer:   [facility]   COMPLETE METABOLIC PANEL WITH GFR   Lipid panel   Vitamin D (25 hydroxy)   TSH   CBC

## 2022-12-20 NOTE — Telephone Encounter (Signed)
Pt scheduled her Physical for May and pt would like her lab orders placed so she can come in ahead of time to get labs

## 2022-12-21 NOTE — Telephone Encounter (Signed)
Task completed. Patient has been updated. Per patient, she will think about having these orders done prior to her visit with the provider. Patient stated she really didn't ask Dr. Shelah Lewandowsky nurse for these tests but it is okay that they were ordered. No other inquiries during the call.

## 2023-01-21 ENCOUNTER — Ambulatory Visit (INDEPENDENT_AMBULATORY_CARE_PROVIDER_SITE_OTHER): Payer: Medicare Other | Admitting: Family Medicine

## 2023-01-21 ENCOUNTER — Encounter: Payer: Self-pay | Admitting: Family Medicine

## 2023-01-21 VITALS — BP 106/58 | HR 66 | Ht 64.0 in | Wt 118.0 lb

## 2023-01-21 DIAGNOSIS — B351 Tinea unguium: Secondary | ICD-10-CM

## 2023-01-21 DIAGNOSIS — E039 Hypothyroidism, unspecified: Secondary | ICD-10-CM | POA: Diagnosis not present

## 2023-01-21 DIAGNOSIS — Z0000 Encounter for general adult medical examination without abnormal findings: Secondary | ICD-10-CM

## 2023-01-21 DIAGNOSIS — E559 Vitamin D deficiency, unspecified: Secondary | ICD-10-CM | POA: Diagnosis not present

## 2023-01-21 DIAGNOSIS — Z8249 Family history of ischemic heart disease and other diseases of the circulatory system: Secondary | ICD-10-CM

## 2023-01-21 DIAGNOSIS — F5101 Primary insomnia: Secondary | ICD-10-CM | POA: Diagnosis not present

## 2023-01-21 DIAGNOSIS — B353 Tinea pedis: Secondary | ICD-10-CM | POA: Diagnosis not present

## 2023-01-21 DIAGNOSIS — L989 Disorder of the skin and subcutaneous tissue, unspecified: Secondary | ICD-10-CM | POA: Insufficient documentation

## 2023-01-21 DIAGNOSIS — Z23 Encounter for immunization: Secondary | ICD-10-CM | POA: Diagnosis not present

## 2023-01-21 MED ORDER — TERBINAFINE HCL 250 MG PO TABS
250.0000 mg | ORAL_TABLET | Freq: Every day | ORAL | 0 refills | Status: DC
Start: 2023-01-21 — End: 2023-09-03

## 2023-01-21 MED ORDER — TRAZODONE HCL 50 MG PO TABS: 25.0000 mg | ORAL_TABLET | Freq: Every evening | ORAL | 3 refills | Status: DC | PRN

## 2023-01-21 NOTE — Assessment & Plan Note (Signed)
She wanted to discuss some additional testing to further risk stratify her risk for heart disease.  We will get a cardio IQ ordered through the lab and schedule her for a cardiac CT calcium score.

## 2023-01-21 NOTE — Assessment & Plan Note (Signed)
She would like to do a trial of trazodone we will start with half a tab at 25 mg and can gradually increase if needed.

## 2023-01-21 NOTE — Assessment & Plan Note (Signed)
Plan to recheck TSH.  

## 2023-01-21 NOTE — Progress Notes (Signed)
Complete physical exam  Patient: Anna Pierce   DOB: Jan 22, 1956   67 y.o. Female  MRN: [id number]  Subjective:    Chief Complaint  Patient presents with   Annual Exam    Anna Pierce is a 67 y.o. female who presents today for a complete physical exam. She reports consuming a general diet.  Uses home gym for cardio, resistance training etc.  She generally feels well. She reports sleeping poorly. She does have additional problems to discuss today.    Most recent fall risk assessment:    01/21/2023    2:27 PM  Fall Risk   Falls in the past year? 0  Number falls in past yr: 0  Injury with Fall? 0  Risk for fall due to : No Fall Risks  Follow up Falls evaluation completed     Most recent depression screenings:    01/21/2023    2:27 PM 11/08/2020   10:43 AM  PHQ 2/9 Scores  PHQ - 2 Score 0 0        Patient Care Team: Agapito Games, MD as PCP - General (Family Medicine)   Outpatient Medications Prior to Visit  Medication Sig   AMBULATORY NON FORMULARY MEDICATION Take 150 mg by mouth daily. Medication Name:Quer Cetin   Ascorbic Acid (VITAMIN C PO) Take by mouth.   calcium-vitamin D (OSCAL WITH D) 500-200 MG-UNIT per tablet Take 1 tablet by mouth daily.   MAGNESIUM PO Take by mouth.   NON FORMULARY 1 tablet daily in the afternoon. Bonafide-Relizen   [DISCONTINUED] omeprazole (PRILOSEC) 20 MG capsule Use omeprazole 20 mg twice a day for 6 weeks, then reduce to 20 once a day for 2 weeks, then discontinue.   No facility-administered medications prior to visit.    ROS        Objective:     BP (!) 106/58   Pulse 66   Ht 5\' 4"  (1.626 m)   Wt 118 lb (53.5 kg)   SpO2 100%   BMI 20.25 kg/m     Physical Exam Vitals and nursing note reviewed. Exam conducted with a chaperone present.  Constitutional:      Appearance: She is well-developed.  HENT:     Head: Normocephalic and atraumatic.     Right Ear: External ear normal.     Left Ear: External  ear normal.     Nose: Nose normal.  Eyes:     Conjunctiva/sclera: Conjunctivae normal.     Pupils: Pupils are equal, round, and reactive to light.  Neck:     Thyroid: No thyromegaly.  Cardiovascular:     Rate and Rhythm: Normal rate and regular rhythm.     Heart sounds: Normal heart sounds.  Pulmonary:     Effort: Pulmonary effort is normal.     Breath sounds: Normal breath sounds. No wheezing.  Chest:  Breasts:    Right: Normal.     Left: Normal.  Musculoskeletal:     Cervical back: Neck supple.  Lymphadenopathy:     Cervical: No cervical adenopathy.  Skin:    General: Skin is warm and dry.  Neurological:     Mental Status: She is alert and oriented to person, place, and time.  Psychiatric:        Behavior: Behavior normal.      No results found for any visits on 01/21/23.      Assessment & Plan:    Routine Health Maintenance and Physical Exam  Immunization History  Administered Date(s)  Administered   Moderna Sars-Covid-2 Vaccination 12/02/2019, 12/30/2019, 08/03/2020   PNEUMOCOCCAL CONJUGATE-20 01/21/2023   Tdap 06/30/2013   Zoster Recombinat (Shingrix) 03/09/2019, 09/23/2019    Health Maintenance  Topic Date Due   Medicare Annual Wellness (AWV)  Never done   HIV Screening  Never done   Hepatitis C Screening  Never done   DEXA SCAN  10/08/2022   MAMMOGRAM  11/25/2022   COVID-19 Vaccine (4 - 2023-24 season) 02/05/2025 (Originally 05/18/2022)   INFLUENZA VACCINE  04/18/2023   DTaP/Tdap/Td (2 - Td or Tdap) 07/01/2023   PAP SMEAR-Modifier  03/08/2024   COLONOSCOPY (Pts 45-31yrs Insurance coverage will need to be confirmed)  01/13/2031   Pneumonia Vaccine 74+ Years old  Completed   Zoster Vaccines- Shingrix  Completed   HPV VACCINES  Aged Out    Discussed health benefits of physical activity, and encouraged her to engage in regular exercise appropriate for her age and condition.  Problem List Items Addressed This Visit       Endocrine   Thyroid activity  decreased    Plan to recheck TSH      Relevant Orders   TSH   B12     Musculoskeletal and Integument   Skin lesion    She does have a suspicious skin lesion on her upper chest.  If it is not healing in the next 2 weeks then I highly recommend consultation with dermatology.      Relevant Orders   Ambulatory referral to Dermatology     Other   Primary insomnia    She would like to do a trial of trazodone we will start with half a tab at 25 mg and can gradually increase if needed.      Relevant Medications   traZODone (DESYREL) 50 MG tablet   Family history of heart disease    She wanted to discuss some additional testing to further risk stratify her risk for heart disease.  We will get a cardio IQ ordered through the lab and schedule her for a cardiac CT calcium score.      Relevant Orders   Cardio IQ (R) Advanced Lipid Panel   CT CARDIAC SCORING (SELF PAY ONLY)   Other Visit Diagnoses     Wellness examination    -  Primary   Relevant Orders   COMPLETE METABOLIC PANEL WITH GFR   CBC   Encounter for immunization       Relevant Orders   Pneumococcal conjugate vaccine 20-valent (Completed)   B12   Vitamin D deficiency       Relevant Orders   VITAMIN D 25 Hydroxy (Vit-D Deficiency, Fractures)   B12   Tinea pedis of right foot       Relevant Medications   terbinafine (LAMISIL) 250 MG tablet   Onychomycosis       Relevant Medications   terbinafine (LAMISIL) 250 MG tablet       Keep up a regular exercise program and make sure you are eating a healthy diet Try to eat 4 servings of dairy a day, or if you are lactose intolerant take a calcium with vitamin D daily.  Your vaccines are up to date.  Prevnar 20 given today.  She said she did get her second shingles vaccine at [facility]. Labs ordered today.  No follow-ups on file.   Mammo already scheduled   Nani Gasser, MD

## 2023-01-21 NOTE — Assessment & Plan Note (Signed)
She does have a suspicious skin lesion on her upper chest.  If it is not healing in the next 2 weeks then I highly recommend consultation with dermatology.

## 2023-01-22 ENCOUNTER — Encounter: Payer: Self-pay | Admitting: Family Medicine

## 2023-01-23 ENCOUNTER — Ambulatory Visit (INDEPENDENT_AMBULATORY_CARE_PROVIDER_SITE_OTHER): Payer: Self-pay

## 2023-01-23 DIAGNOSIS — Z8249 Family history of ischemic heart disease and other diseases of the circulatory system: Secondary | ICD-10-CM

## 2023-01-25 NOTE — Progress Notes (Signed)
Hi Anna Pierce, fantastic news!  Your Anna Pierce area calcium score was 0.  Aced on this I would not recommend statin therapy and they recommend reassessing in 5 to 10 years.

## 2023-01-28 NOTE — Progress Notes (Signed)
Hi Verlia, also did an over read on the CT to look at the surrounding tissues besides just the coronary arteries.  They did note some calcified mediastinal and hilar lymph nodes consistent with a history of sarcoidosis.  Where you diagnosed with this in the past?

## 2023-01-29 ENCOUNTER — Encounter: Payer: Self-pay | Admitting: Family Medicine

## 2023-01-30 ENCOUNTER — Encounter: Payer: Self-pay | Admitting: Family Medicine

## 2023-01-30 ENCOUNTER — Ambulatory Visit (INDEPENDENT_AMBULATORY_CARE_PROVIDER_SITE_OTHER): Payer: Medicare Other

## 2023-01-30 DIAGNOSIS — Z780 Asymptomatic menopausal state: Secondary | ICD-10-CM | POA: Diagnosis not present

## 2023-01-30 DIAGNOSIS — Z1231 Encounter for screening mammogram for malignant neoplasm of breast: Secondary | ICD-10-CM

## 2023-01-30 DIAGNOSIS — N951 Menopausal and female climacteric states: Secondary | ICD-10-CM

## 2023-01-30 DIAGNOSIS — M810 Age-related osteoporosis without current pathological fracture: Secondary | ICD-10-CM | POA: Diagnosis not present

## 2023-01-30 NOTE — Progress Notes (Signed)
Hi Anna Pierce.  Bone density shows a T-score of -2.9 this is most consistent with osteoporosis.   The current recommendation for osteoporosis treatment includes:   #1 calcium-total of 1200 mg of calcium daily.  If you eat a very calcium rich diet you may be able to obtain that without a supplement.  If not, then I recommend calcium 500 mg twice a day.  There are several products over-the-counter such as Caltrate D and Viactiv chews which are great options that contain calcium and vitamin D. #2 vitamin D-recommend 800 international units daily. #3 exercise-recommend 30 minutes of weightbearing exercise 3 days a week.  Resistance training ,such as doing bands and light weights, can be particularly helpful. #4 medication-if you are not currently on a bone builder, also called a bisphosphonate, then this has been shown to be very helpful in maintaining bone strength, preventing further thinning of the bones, and reducing your risk for fractures.  I would highly recommend that you consider starting 1 of these medications.  If you are okay with that then please let us know and we will send one to your pharmacy.  If you would like to discuss further we are happy to make an appointment for you so that we can go over options for treatment.

## 2023-01-31 NOTE — Progress Notes (Signed)
Please call patient. Normal mammogram.  Repeat in 1 year.  

## 2023-02-05 DIAGNOSIS — E039 Hypothyroidism, unspecified: Secondary | ICD-10-CM | POA: Diagnosis not present

## 2023-02-05 DIAGNOSIS — E559 Vitamin D deficiency, unspecified: Secondary | ICD-10-CM | POA: Diagnosis not present

## 2023-02-05 DIAGNOSIS — Z23 Encounter for immunization: Secondary | ICD-10-CM | POA: Diagnosis not present

## 2023-02-05 DIAGNOSIS — Z0000 Encounter for general adult medical examination without abnormal findings: Secondary | ICD-10-CM | POA: Diagnosis not present

## 2023-02-05 DIAGNOSIS — E7841 Elevated Lipoprotein(a): Secondary | ICD-10-CM | POA: Diagnosis not present

## 2023-02-05 LAB — CBC: RDW: 11.6 % (ref 11.0–15.0)

## 2023-02-06 LAB — VITAMIN B12: Vitamin B-12: 1553 pg/mL — ABNORMAL HIGH (ref 200–1100)

## 2023-02-06 LAB — CBC
Hemoglobin: 15.2 g/dL (ref 11.7–15.5)
RBC: 4.92 10*6/uL (ref 3.80–5.10)
WBC: 4.7 10*3/uL (ref 3.8–10.8)

## 2023-02-06 LAB — COMPLETE METABOLIC PANEL WITH GFR
AST: 21 U/L (ref 10–35)
Albumin: 4.5 g/dL (ref 3.6–5.1)
BUN/Creatinine Ratio: 11 (calc) (ref 6–22)
Globulin: 2.3 g/dL (calc) (ref 1.9–3.7)
Sodium: 139 mmol/L (ref 135–146)
Total Protein: 6.8 g/dL (ref 6.1–8.1)

## 2023-02-06 LAB — TSH: TSH: 2.07 mIU/L (ref 0.40–4.50)

## 2023-02-06 NOTE — Progress Notes (Signed)
NO anemia.  Renal function up a little. Plan to recheck in 3 weeks. Hydrate well. B12 is high so can decrease how often taking B12. Vitamn D is really go to ok to decrease back to maintenance dose of 1000IU/34mcg daily.

## 2023-02-07 NOTE — Progress Notes (Signed)
Okay, that makes sense that can definitely bump the serum creatinine.  We can always recheck it again in a few weeks off of the supplement.

## 2023-02-08 NOTE — Progress Notes (Signed)
OK that is fine.  Thank you

## 2023-02-10 LAB — COMPLETE METABOLIC PANEL WITH GFR
AG Ratio: 2 (calc) (ref 1.0–2.5)
ALT: 24 U/L (ref 6–29)
Alkaline phosphatase (APISO): 109 U/L (ref 37–153)
BUN: 13 mg/dL (ref 7–25)
CO2: 30 mmol/L (ref 20–32)
Calcium: 9.6 mg/dL (ref 8.6–10.4)
Chloride: 100 mmol/L (ref 98–110)
Creat: 1.17 mg/dL — ABNORMAL HIGH (ref 0.50–1.05)
Glucose, Bld: 95 mg/dL (ref 65–99)
Potassium: 4.1 mmol/L (ref 3.5–5.3)
Total Bilirubin: 0.8 mg/dL (ref 0.2–1.2)
eGFR: 52 mL/min/{1.73_m2} — ABNORMAL LOW (ref >=60)

## 2023-02-10 LAB — CARDIO IQ(R) ADVANCED LIPID PANEL
Apolipoprotein B: 93 mg/dL — ABNORMAL HIGH (ref <90)
Cholesterol: 212 mg/dL — ABNORMAL HIGH (ref <200)
HDL LARGE: 5310 nmol/L — ABNORMAL LOW (ref >6729)
HDL: 70 mg/dL (ref >49)
LDL Cholesterol (Calc): 121 mg/dL (calc) — ABNORMAL HIGH (ref <100)
LDL Medium: 299 nmol/L — ABNORMAL HIGH (ref <215)
LDL Particle Number: 1293 nmol/L — ABNORMAL HIGH (ref <1138)
LDL Peak Size: 219.9 Angstrom — ABNORMAL LOW (ref >222.9)
LDL Small: 215 nmol/L — ABNORMAL HIGH (ref <142)
Lipoprotein (a): <10 nmol/L (ref <75)
Non-HDL Cholesterol (Calc): 142 mg/dL (calc) — ABNORMAL HIGH (ref <130)
Total CHOL/HDL Ratio: 3 calc (ref <5.0)
Triglycerides: 105 mg/dL (ref <150)

## 2023-02-10 LAB — CBC
HCT: 45.9 % — ABNORMAL HIGH (ref 35.0–45.0)
MCH: 30.9 pg (ref 27.0–33.0)
MCHC: 33.1 g/dL (ref 32.0–36.0)
MCV: 93.3 fL (ref 80.0–100.0)
MPV: 9.8 fL (ref 7.5–12.5)
Platelets: 357 10*3/uL (ref 140–400)

## 2023-02-10 LAB — VITAMIN D 25 HYDROXY (VIT D DEFICIENCY, FRACTURES): Vit D, 25-Hydroxy: 83 ng/mL (ref 30–100)

## 2023-02-12 DIAGNOSIS — K080 Exfoliation of teeth due to systemic causes: Secondary | ICD-10-CM | POA: Diagnosis not present

## 2023-02-13 NOTE — Progress Notes (Signed)
Hi Sierah, you can take a look at the cardio IQ panel.  The direct LDL particle number measurement versus the calculation the traditional cholesterol panel shows a number that is considered moderate risk.  There are different sized molecules and they do evaluate each of those categories.  You do have a relatively high number of the small molecules which again puts you into a moderate risk category.  You have a high level of the medium size LDL molecules and that puts you into a moderate risk category.  Your large HDL molecules are actually under the threshold that we would typically like to see and so you are at high risk in that category.  You have a shift more towards the smaller sticky particles and less of the big fluffy good kind of particles.  They do categorize the LDL pattern as either optimal or high.  Based on your numbers they did write you an 8 which is optimal.  Or pattern A.  You have slightly elevated levels of APO B as well which puts you into a moderate risk category and year lipoprotein a is optimal which is great.  At this point with slightly increased risk based on the particle numbers but a calcium score that looks fantastic on the CT,, I think we can hold off on a statin and just continue to put your focus on healthy diet and regular exercise at this point.

## 2023-02-18 DIAGNOSIS — C44519 Basal cell carcinoma of skin of other part of trunk: Secondary | ICD-10-CM | POA: Diagnosis not present

## 2023-02-18 DIAGNOSIS — L821 Other seborrheic keratosis: Secondary | ICD-10-CM | POA: Diagnosis not present

## 2023-02-18 DIAGNOSIS — L719 Rosacea, unspecified: Secondary | ICD-10-CM | POA: Diagnosis not present

## 2023-02-18 DIAGNOSIS — D225 Melanocytic nevi of trunk: Secondary | ICD-10-CM | POA: Diagnosis not present

## 2023-02-18 DIAGNOSIS — L814 Other melanin hyperpigmentation: Secondary | ICD-10-CM | POA: Diagnosis not present

## 2023-05-28 DIAGNOSIS — K080 Exfoliation of teeth due to systemic causes: Secondary | ICD-10-CM | POA: Diagnosis not present

## 2023-07-30 ENCOUNTER — Encounter: Payer: Self-pay | Admitting: Family Medicine

## 2023-07-30 ENCOUNTER — Ambulatory Visit (INDEPENDENT_AMBULATORY_CARE_PROVIDER_SITE_OTHER): Payer: Medicare Other | Admitting: Family Medicine

## 2023-07-30 VITALS — BP 104/56 | HR 77 | Ht 64.0 in | Wt 121.0 lb

## 2023-07-30 DIAGNOSIS — Z23 Encounter for immunization: Secondary | ICD-10-CM

## 2023-07-30 DIAGNOSIS — M7061 Trochanteric bursitis, right hip: Secondary | ICD-10-CM

## 2023-07-30 DIAGNOSIS — M79671 Pain in right foot: Secondary | ICD-10-CM

## 2023-07-30 DIAGNOSIS — H02403 Unspecified ptosis of bilateral eyelids: Secondary | ICD-10-CM

## 2023-07-30 DIAGNOSIS — M25512 Pain in left shoulder: Secondary | ICD-10-CM | POA: Diagnosis not present

## 2023-07-30 DIAGNOSIS — M79651 Pain in right thigh: Secondary | ICD-10-CM

## 2023-07-30 DIAGNOSIS — G8929 Other chronic pain: Secondary | ICD-10-CM

## 2023-07-30 NOTE — Progress Notes (Signed)
Established Patient Office Visit  Subjective   Patient ID: Anna Pierce, female    DOB: [birth year]  Age: 67 y.o. MRN: [id number]  Chief Complaint  Patient presents with   Leg Pain    Right leg    HPI  C/o left right leg pain, left shoulder pain x 2 months.  No specific injury or trauma she works out regularly but does not have any problem with extension above her head it is mostly with external rotation and reaching behind her back.  Does get some relief with ibuprofen.  Right leg pain mostly over the right greater trochanter and sometimes over that right lower upper leg just below her knee.  This has been going on for months as well.  Right heel pain x 6 months.  She had walked [place] a mountain about 3 miles and then ran back down and then later was pushing up her grandchild's baby stroller and felt sudden pain in her right heel and it has been bothersome ever since.  Would like a referral for PT.    She also reports droopy eyelids and wanted to know any recommendations.    ROS    Objective:     BP (!) 104/56   Pulse 77   Ht 5\' 4"  (1.626 m)   Wt 121 lb (54.9 kg)   SpO2 100%   BMI 20.77 kg/m    Physical Exam Vitals reviewed.  Constitutional:      Appearance: Normal appearance.  HENT:     Head: Normocephalic.  Pulmonary:     Effort: Pulmonary effort is normal.  Musculoskeletal:     Comments: Tender over the greater trochanter.  Also tender laterally just below her right knee.  Neurological:     Mental Status: She is alert and oriented to person, place, and time.  Psychiatric:        Mood and Affect: Mood normal.        Behavior: Behavior normal.      No results found for any visits on 07/30/23.    The 10-year ASCVD risk score (Arnett DK, et al., 2019) is: 3.5%    Assessment & Plan:   Problem List Items Addressed This Visit   None Visit Diagnoses     Acute pain of left shoulder    -  Primary   Relevant Orders   Ambulatory referral to  Physical Therapy   Greater trochanteric bursitis, right       Relevant Orders   Ambulatory referral to Physical Therapy   Chronic heel pain, right       Relevant Orders   Ambulatory referral to Physical Therapy   Right thigh pain       Relevant Orders   Ambulatory referral to Physical Therapy   Encounter for immunization       Relevant Orders   Pfizer Comirnaty Covid-19 Vaccine 50yrs & older (Completed)      She has several joints that are of concern today we discussed referral for formal physical therapy she is particularly interested just interested in dry needling.  Referral placed.  She does get some temporary relief with ibuprofen.  Right heel pain-it sounds like she may have had more of an acute injury versus plantar fasciitis would like to get her in with sports medicine for further workup since she has had persistent pain for 6 months at this point.  Trochanteric bursitis, right-meant to give her handout while she was here today on home PT exercises we will call  and get those mailed to her.  Clines flu vaccine, COVID-vaccine given today.  Return in about 8 weeks (around 09/24/2023) for Welcome to Medicare .    Nani Gasser, MD
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
## 2023-08-05 ENCOUNTER — Encounter: Payer: Self-pay | Admitting: Rehabilitative and Restorative Service Providers"

## 2023-08-05 ENCOUNTER — Ambulatory Visit: Payer: Medicare Other | Attending: Family Medicine | Admitting: Rehabilitative and Restorative Service Providers"

## 2023-08-05 ENCOUNTER — Other Ambulatory Visit: Payer: Self-pay

## 2023-08-05 DIAGNOSIS — M7061 Trochanteric bursitis, right hip: Secondary | ICD-10-CM | POA: Diagnosis not present

## 2023-08-05 DIAGNOSIS — M79651 Pain in right thigh: Secondary | ICD-10-CM | POA: Diagnosis not present

## 2023-08-05 DIAGNOSIS — M79671 Pain in right foot: Secondary | ICD-10-CM | POA: Insufficient documentation

## 2023-08-05 DIAGNOSIS — M259 Joint disorder, unspecified: Secondary | ICD-10-CM | POA: Insufficient documentation

## 2023-08-05 DIAGNOSIS — M25551 Pain in right hip: Secondary | ICD-10-CM | POA: Insufficient documentation

## 2023-08-05 DIAGNOSIS — M6281 Muscle weakness (generalized): Secondary | ICD-10-CM | POA: Insufficient documentation

## 2023-08-05 DIAGNOSIS — M25512 Pain in left shoulder: Secondary | ICD-10-CM | POA: Insufficient documentation

## 2023-08-05 DIAGNOSIS — M79604 Pain in right leg: Secondary | ICD-10-CM | POA: Insufficient documentation

## 2023-08-05 DIAGNOSIS — R29898 Other symptoms and signs involving the musculoskeletal system: Secondary | ICD-10-CM | POA: Insufficient documentation

## 2023-08-09 ENCOUNTER — Ambulatory Visit: Payer: Medicare Other

## 2023-08-09 ENCOUNTER — Encounter: Payer: Self-pay | Admitting: Sports Medicine

## 2023-08-09 ENCOUNTER — Ambulatory Visit (INDEPENDENT_AMBULATORY_CARE_PROVIDER_SITE_OTHER): Payer: Medicare Other | Admitting: Sports Medicine

## 2023-08-09 DIAGNOSIS — M19071 Primary osteoarthritis, right ankle and foot: Secondary | ICD-10-CM | POA: Diagnosis not present

## 2023-08-09 DIAGNOSIS — S43432A Superior glenoid labrum lesion of left shoulder, initial encounter: Secondary | ICD-10-CM | POA: Insufficient documentation

## 2023-08-09 DIAGNOSIS — M19012 Primary osteoarthritis, left shoulder: Secondary | ICD-10-CM | POA: Diagnosis not present

## 2023-08-09 DIAGNOSIS — M722 Plantar fascial fibromatosis: Secondary | ICD-10-CM | POA: Diagnosis not present

## 2023-08-09 DIAGNOSIS — M25512 Pain in left shoulder: Secondary | ICD-10-CM | POA: Diagnosis not present

## 2023-08-09 DIAGNOSIS — M79671 Pain in right foot: Secondary | ICD-10-CM | POA: Diagnosis not present

## 2023-08-09 MED ORDER — CELECOXIB 200 MG PO CAPS
ORAL_CAPSULE | ORAL | 2 refills | Status: DC
Start: 2023-08-09 — End: 2023-08-26

## 2023-08-09 NOTE — Assessment & Plan Note (Signed)
This is a very pleasant 67 year old female, she has had at least 6 months of pain right heel plantar aspect after doing a hike, she did feel a tearing sensation On exam she has tenderness plantar fascial origin but she does not have the typical morning pain. Adding x-rays, air heel brace, referral for custom molded orthotics, Celebrex as ibuprofen is hurting her stomach. She will avoid barefoot walking and she will do home physical therapy for retraining of her foot intrinsic musculature. Return to see me in about 4 to 6 weeks, we will consider injection if not better.

## 2023-08-09 NOTE — Progress Notes (Signed)
    Procedures performed today:    None.  Independent interpretation of notes and tests performed by another provider:   None.  Brief History, Exam, Impression, and Recommendations:    Plantar fasciitis, right This is a very pleasant 67 year old female, she has had at least 6 months of pain right heel plantar aspect after doing a hike, she did feel a tearing sensation On exam she has tenderness plantar fascial origin but she does not have the typical morning pain. Adding x-rays, air heel brace, referral for custom molded orthotics, Celebrex as ibuprofen is hurting her stomach. She will avoid barefoot walking and she will do home physical therapy for retraining of her foot intrinsic musculature. Return to see me in about 4 to 6 weeks, we will consider injection if not better.  Labral tear of shoulder, left, initial encounter Also with a long history of pain left shoulder, she has positive labral signs including a positive O'Brien's test, minimally positive speeds test, rotator cuff testing is negative. Adding x-rays, she will continue formal physical therapy for 4 to 6 weeks and if insufficient improvement we will proceed with MR arthrography. Celebrex for pain in the meantime.    ____________________________________________ Ihor Austin. Benjamin Stain, M.D., ABFM., CAQSM., AME. Primary Care and Sports Medicine [facility]  Adjunct Professor of Family Medicine  [employer]  Restaurant manager, fast food

## 2023-08-09 NOTE — Assessment & Plan Note (Signed)
Also with a long history of pain left shoulder, she has positive labral signs including a positive O'Brien's test, minimally positive speeds test, rotator cuff testing is negative. Adding x-rays, she will continue formal physical therapy for 4 to 6 weeks and if insufficient improvement we will proceed with MR arthrography. Celebrex for pain in the meantime.

## 2023-08-12 ENCOUNTER — Ambulatory Visit: Payer: Medicare Other | Admitting: Rehabilitative and Restorative Service Providers"

## 2023-08-12 ENCOUNTER — Encounter: Payer: Self-pay | Admitting: Rehabilitative and Restorative Service Providers"

## 2023-08-12 DIAGNOSIS — M6281 Muscle weakness (generalized): Secondary | ICD-10-CM | POA: Diagnosis not present

## 2023-08-12 DIAGNOSIS — M25551 Pain in right hip: Secondary | ICD-10-CM | POA: Diagnosis not present

## 2023-08-12 DIAGNOSIS — M7061 Trochanteric bursitis, right hip: Secondary | ICD-10-CM | POA: Diagnosis not present

## 2023-08-12 DIAGNOSIS — M259 Joint disorder, unspecified: Secondary | ICD-10-CM

## 2023-08-12 DIAGNOSIS — R29898 Other symptoms and signs involving the musculoskeletal system: Secondary | ICD-10-CM

## 2023-08-12 DIAGNOSIS — M25512 Pain in left shoulder: Secondary | ICD-10-CM | POA: Diagnosis not present

## 2023-08-12 DIAGNOSIS — M79651 Pain in right thigh: Secondary | ICD-10-CM | POA: Diagnosis not present

## 2023-08-12 DIAGNOSIS — M79604 Pain in right leg: Secondary | ICD-10-CM | POA: Diagnosis not present

## 2023-08-12 DIAGNOSIS — M79671 Pain in right foot: Secondary | ICD-10-CM | POA: Diagnosis not present
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
## 2023-08-19 ENCOUNTER — Ambulatory Visit: Payer: Medicare Other | Attending: Family Medicine

## 2023-08-19 DIAGNOSIS — M25551 Pain in right hip: Secondary | ICD-10-CM | POA: Insufficient documentation

## 2023-08-19 DIAGNOSIS — M6281 Muscle weakness (generalized): Secondary | ICD-10-CM | POA: Insufficient documentation

## 2023-08-19 DIAGNOSIS — M79604 Pain in right leg: Secondary | ICD-10-CM | POA: Insufficient documentation

## 2023-08-19 DIAGNOSIS — R29898 Other symptoms and signs involving the musculoskeletal system: Secondary | ICD-10-CM | POA: Diagnosis not present

## 2023-08-19 DIAGNOSIS — M259 Joint disorder, unspecified: Secondary | ICD-10-CM | POA: Diagnosis not present
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
## 2023-08-21 ENCOUNTER — Ambulatory Visit: Payer: Medicare Other | Admitting: Rehabilitative and Restorative Service Providers"

## 2023-08-21 ENCOUNTER — Other Ambulatory Visit: Payer: Self-pay | Admitting: Family Medicine

## 2023-08-21 DIAGNOSIS — F5101 Primary insomnia: Secondary | ICD-10-CM

## 2023-08-23 ENCOUNTER — Ambulatory Visit: Payer: Medicare Other | Admitting: Sports Medicine

## 2023-08-23 VITALS — BP 112/72 | Ht 64.0 in | Wt 120.0 lb

## 2023-08-23 DIAGNOSIS — M722 Plantar fascial fibromatosis: Secondary | ICD-10-CM | POA: Diagnosis not present

## 2023-08-23 NOTE — Progress Notes (Signed)
   PCP: Agapito Games, MD  SUBJECTIVE:   HPI:  Patient is a 67 y.o. female here for orthotics. Referred over by Dr. Benjamin Stain in the setting of right plantar fascia injury. She has been doing better and continues with her intrinsic foot stretches. She states she has an old pair of orthotics made by her chiropractor many years ago, though these are all broken down and she is interested in a new pair. No other complaints.  ROS:     See HPI  PERTINENT  PMH / PSH FH / / SH:  Past Medical, Surgical, Social, and Family History Reviewed & Updated in the EMR.  Pertinent findings include:  Non-contributory  OBJECTIVE:  BP 112/72   Ht 5\' 4"  (1.626 m)   Wt 120 lb (54.4 kg)   BMI 20.60 kg/m   PHYSICAL EXAM:  GEN: Alert and Oriented, NAD, comfortable in exam room RESP: Unlabored respirations, symmetric chest rise PSY: normal mood, congruent affect   FOOT EXAM: Cavus arches bilaterally. Mild loss of transverse arches with collapse of 2-4 MT heads. No significant calluses on plantar aspect of feet. Rolling of 5th toes bilaterally. TTP over the medial calcaneus and proximal PF on the right. No pain along the MT heads on bilateral feet.  Assessment & Plan Plantar fasciitis, right Patient was fitted for a: standard, cushioned, semi-rigid orthotic. The orthotic was heated and afterward the patient stood on the orthotic blank positioned on the orthotic stand. The patient was positioned in subtalar neutral position and 10 degrees of ankle dorsiflexion in a weight bearing stance on the heated orthotic blank. After completion of molding, a stable base was applied to the orthotic blank. The blank was ground to a stable position for weight bearing. Size: 6 Base: None Posting: None Additional orthotic padding: None  She ambulated and noted comfort, however orthotic felt slightly narrow in the toe box of her right shoe. I offered to add some additional padding, however she stated she  wanted to test it in another pair of shoes at home and will return if she would like some additional modification done.   Glean Salen, MD PGY-4, Sports Medicine Fellow [facility]  Addendum:  I was the preceptor for this visit and available for immediate consultation.  Norton Blizzard MD Marrianne Mood

## 2023-08-23 NOTE — Assessment & Plan Note (Signed)
Patient was fitted for a: standard, cushioned, semi-rigid orthotic. The orthotic was heated and afterward the patient stood on the orthotic blank positioned on the orthotic stand. The patient was positioned in subtalar neutral position and 10 degrees of ankle dorsiflexion in a weight bearing stance on the heated orthotic blank. After completion of molding, a stable base was applied to the orthotic blank. The blank was ground to a stable position for weight bearing. Size: 6 Base: None Posting: None Additional orthotic padding: None  She ambulated and noted comfort, however orthotic felt slightly narrow in the toe box of her right shoe. I offered to add some additional padding, however she stated she wanted to test it in another pair of shoes at home and will return if she would like some additional modification done.

## 2023-08-26 ENCOUNTER — Encounter: Payer: Self-pay | Admitting: Physical Therapy

## 2023-08-26 ENCOUNTER — Encounter (INDEPENDENT_AMBULATORY_CARE_PROVIDER_SITE_OTHER): Payer: Self-pay | Admitting: Sports Medicine

## 2023-08-26 ENCOUNTER — Ambulatory Visit: Payer: Medicare Other | Admitting: Physical Therapy

## 2023-08-26 DIAGNOSIS — M79604 Pain in right leg: Secondary | ICD-10-CM | POA: Diagnosis not present

## 2023-08-26 DIAGNOSIS — M25551 Pain in right hip: Secondary | ICD-10-CM

## 2023-08-26 DIAGNOSIS — R29898 Other symptoms and signs involving the musculoskeletal system: Secondary | ICD-10-CM

## 2023-08-26 DIAGNOSIS — M6281 Muscle weakness (generalized): Secondary | ICD-10-CM

## 2023-08-26 DIAGNOSIS — M722 Plantar fascial fibromatosis: Secondary | ICD-10-CM | POA: Diagnosis not present

## 2023-08-26 DIAGNOSIS — M259 Joint disorder, unspecified: Secondary | ICD-10-CM

## 2023-08-26 NOTE — Telephone Encounter (Signed)
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
## 2023-08-28 ENCOUNTER — Ambulatory Visit: Payer: Medicare Other

## 2023-08-28 DIAGNOSIS — R29898 Other symptoms and signs involving the musculoskeletal system: Secondary | ICD-10-CM

## 2023-08-28 DIAGNOSIS — M259 Joint disorder, unspecified: Secondary | ICD-10-CM

## 2023-08-28 DIAGNOSIS — M79604 Pain in right leg: Secondary | ICD-10-CM | POA: Diagnosis not present

## 2023-08-28 DIAGNOSIS — M6281 Muscle weakness (generalized): Secondary | ICD-10-CM | POA: Diagnosis not present

## 2023-08-28 DIAGNOSIS — M25551 Pain in right hip: Secondary | ICD-10-CM | POA: Diagnosis not present
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
## 2023-09-02 ENCOUNTER — Ambulatory Visit: Payer: Medicare Other | Admitting: Physical Therapy

## 2023-09-02 DIAGNOSIS — M6281 Muscle weakness (generalized): Secondary | ICD-10-CM | POA: Diagnosis not present

## 2023-09-02 DIAGNOSIS — M259 Joint disorder, unspecified: Secondary | ICD-10-CM

## 2023-09-02 DIAGNOSIS — M79604 Pain in right leg: Secondary | ICD-10-CM | POA: Diagnosis not present

## 2023-09-02 DIAGNOSIS — M25551 Pain in right hip: Secondary | ICD-10-CM | POA: Diagnosis not present

## 2023-09-02 DIAGNOSIS — R29898 Other symptoms and signs involving the musculoskeletal system: Secondary | ICD-10-CM
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
## 2023-09-03 ENCOUNTER — Encounter: Payer: Self-pay | Admitting: Family Medicine

## 2023-09-03 ENCOUNTER — Ambulatory Visit (INDEPENDENT_AMBULATORY_CARE_PROVIDER_SITE_OTHER): Payer: Medicare Other | Admitting: Family Medicine

## 2023-09-03 VITALS — BP 110/59 | HR 75 | Ht 64.0 in | Wt 119.0 lb

## 2023-09-03 DIAGNOSIS — Z0000 Encounter for general adult medical examination without abnormal findings: Secondary | ICD-10-CM | POA: Diagnosis not present

## 2023-09-03 NOTE — Progress Notes (Signed)
Annual Wellness Visit, Welcome      Patient: Anna Pierce, Female    DOB: 13-Feb-1956, 67 y.o.   MRN: [id number]  Subjective  Chief Complaint  Patient presents with   Annual Exam    Jazzmen Dixon is a 67 y.o. female who presents today for her Annual Wellness Visit. She reports consuming a general diet.  Stays really active. Having shoulder problems recently.    Going to PT, had dry needling.  Wants to know steroid shot had years ago for contact dermatitis.  She generally feels well. She reports sleeping fair, using half a tab of trazodone and that is working well. . She does not have additional problems to discuss today.   HPI        Medications: Outpatient Medications Prior to Visit  Medication Sig   AMBULATORY NON FORMULARY MEDICATION Take 150 mg by mouth daily. Medication Name:Quer Cetin   Ascorbic Acid (VITAMIN C PO) Take by mouth.   CALCIUM PO Take 2 g by mouth daily.   calcium-vitamin D (OSCAL WITH D) 500-200 MG-UNIT per tablet Take 1 tablet by mouth daily.   ketoconazole (NIZORAL) 2 % cream Apply 1 Application topically daily.   MAGNESIUM PO Take by mouth.   NON FORMULARY 1 tablet daily in the afternoon. Bonafide-Relizen   [DISCONTINUED] traZODone (DESYREL) 50 MG tablet Take 0.5-1.5 tablets (25-75 mg total) by mouth at bedtime as needed for sleep.   [DISCONTINUED] terbinafine (LAMISIL) 250 MG tablet Take 1 tablet (250 mg total) by mouth daily.   No facility-administered medications prior to visit.    Allergies  Allergen Reactions   Lactase Shortness Of Breath    Said she is not allergic to this    Penicillins Other (See Comments)    Rash    Flagyl [Metronidazole] Hives   Milk (Cow) Other (See Comments)    Asthma  Said she is allergic to the protein    Casein    Celebrex [Celecoxib] Itching   Prednisone Other (See Comments)    Suicidal thoughts   [place]     Patient Care Team: Agapito Games, MD as PCP - General (Family  Medicine)  ROS      Objective  BP (!) 110/59   Pulse 75   Ht 5\' 4"  (1.626 m)   Wt 119 lb (54 kg)   SpO2 100%   BMI 20.43 kg/m     Physical Exam Vitals and nursing note reviewed.  Constitutional:      Appearance: Normal appearance.  HENT:     Head: Normocephalic and atraumatic.  Eyes:     Conjunctiva/sclera: Conjunctivae normal.  Cardiovascular:     Rate and Rhythm: Normal rate and regular rhythm.  Pulmonary:     Effort: Pulmonary effort is normal.     Breath sounds: Normal breath sounds.  Skin:    General: Skin is warm and dry.  Neurological:     Mental Status: She is alert.  Psychiatric:        Mood and Affect: Mood normal.       Most recent functional status assessment:    09/03/2023   12:45 PM  In your present state of health, do you have any difficulty performing the following activities:  Preparing Food and eating ? N  Using the Toilet? N  In the past six months, have you accidently leaked urine? N  Do you have problems with loss of bowel control? N  Managing your Medications? N  Managing your Finances? N  Housekeeping or managing your Housekeeping? N   Most recent fall risk assessment:    01/21/2023    2:27 PM  Fall Risk   Falls in the past year? 0  Number falls in past yr: 0  Injury with Fall? 0  Risk for fall due to : No Fall Risks  Follow up Falls evaluation completed    Most recent depression screenings:    09/03/2023   10:13 AM 01/21/2023    2:27 PM  PHQ 2/9 Scores  PHQ - 2 Score 0 0   Most recent cognitive screening:    09/03/2023   12:30 PM  6CIT Screen  What Year? 0 points  What month? 0 points  What time? 0 points  Count back from 20 0 points  Months in reverse 0 points  Repeat phrase 0 points  Total Score 0 points   Most recent Audit-C alcohol use screening    09/03/2023   10:13 AM  Alcohol Use Disorder Test (AUDIT)  1. How often do you have a drink containing alcohol? 1  2. How many drinks containing alcohol do  you have on a typical day when you are drinking? 0  3. How often do you have six or more drinks on one occasion? 0  AUDIT-C Score 1   A score of 3 or more in women, and 4 or more in men indicates increased risk for alcohol abuse, EXCEPT if all of the points are from question 1   Vision/Hearing Screen: No results found.     No results found for any visits on 09/03/23.    Assessment & Plan   Annual wellness visit done today including the all of the following: Reviewed patient's Family Medical History Reviewed and updated list of patient's medical providers Assessment of cognitive impairment was done Assessed patient's functional ability Established a written schedule for health screening services Health Risk Assessent Completed and Reviewed  Exercise Activities and Dietary recommendations  Goals      Exercise 150 min/wk Moderate Activity     Keep up the exercise!!!!!         Immunization History  Administered Date(s) Administered   Moderna Sars-Covid-2 Vaccination 12/02/2019, 12/30/2019, 08/03/2020   PNEUMOCOCCAL CONJUGATE-20 01/21/2023   Pfizer(Comirnaty)Fall Seasonal Vaccine 12 years and older 07/30/2023   Tdap 06/30/2013   Zoster Recombinant(Shingrix) 03/09/2019, 09/23/2019    Health Maintenance  Topic Date Due   HIV Screening  Never done   Hepatitis C Screening  Never done   INFLUENZA VACCINE  12/16/2023 (Originally 04/18/2023)   DTaP/Tdap/Td (2 - Td or Tdap) 06/30/2024 (Originally 07/01/2023)   MAMMOGRAM  01/30/2024   Cervical Cancer Screening (HPV/Pap Cotest)  03/08/2024   Medicare Annual Wellness (AWV)  09/02/2024   Colonoscopy  01/13/2031   Pneumonia Vaccine 36+ Years old  Completed   DEXA SCAN  Completed   COVID-19 Vaccine  Completed   Zoster Vaccines- Shingrix  Completed   HPV VACCINES  Aged Out     Discussed health benefits of physical activity, and encouraged her to engage in regular exercise appropriate for her age and condition.    Problem List  Items Addressed This Visit   None Visit Diagnoses       Welcome to Medicare preventive visit    -  Primary   Relevant Orders   EKG 12-Lead      EKG shows rate of 63 bpm, NSR, no acute changes.  Neg QRS in lead 1, indicating borderline right axis deviation.   Return in  about 1 year (around 09/02/2024) for wellness exam.     Nani Gasser, MD

## 2023-09-03 NOTE — Patient Instructions (Addendum)
  Anna Pierce , Thank you for taking time to come for your Medicare Wellness Visit. I appreciate your ongoing commitment to your health goals. Please review the following plan we discussed and let me know if I can assist you in the future.   These are the goals  Goals      Exercise 150 min/wk Moderate Activity     Keep up the exercise!!!!!         This is a list of the screening recommended for you and due dates:  Health Maintenance  Topic Date Due   HIV Screening  Never done   Hepatitis C Screening  Never done   Flu Shot  12/16/2023*   DTaP/Tdap/Td vaccine (2 - Td or Tdap) 06/30/2024*   Mammogram  01/30/2024   Pap with HPV screening  03/08/2024   Medicare Annual Wellness Visit  09/02/2024   Colon Cancer Screening  01/13/2031   Pneumonia Vaccine  Completed   DEXA scan (bone density measurement)  Completed   COVID-19 Vaccine  Completed   Zoster (Shingles) Vaccine  Completed   HPV Vaccine  Aged Out  *Topic was postponed. The date shown is not the original due date.
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
## 2023-09-05 ENCOUNTER — Ambulatory Visit: Payer: Medicare Other | Admitting: Physical Therapy

## 2023-09-05 ENCOUNTER — Encounter: Payer: Self-pay | Admitting: Physical Therapy

## 2023-09-05 DIAGNOSIS — M6281 Muscle weakness (generalized): Secondary | ICD-10-CM

## 2023-09-05 DIAGNOSIS — M259 Joint disorder, unspecified: Secondary | ICD-10-CM | POA: Diagnosis not present

## 2023-09-05 DIAGNOSIS — M25551 Pain in right hip: Secondary | ICD-10-CM

## 2023-09-05 DIAGNOSIS — M79604 Pain in right leg: Secondary | ICD-10-CM | POA: Diagnosis not present

## 2023-09-05 DIAGNOSIS — R29898 Other symptoms and signs involving the musculoskeletal system: Secondary | ICD-10-CM | POA: Diagnosis not present

## 2023-09-13 ENCOUNTER — Ambulatory Visit: Payer: Medicare Other

## 2023-09-13 DIAGNOSIS — R29898 Other symptoms and signs involving the musculoskeletal system: Secondary | ICD-10-CM

## 2023-09-13 DIAGNOSIS — M259 Joint disorder, unspecified: Secondary | ICD-10-CM

## 2023-09-13 DIAGNOSIS — M79604 Pain in right leg: Secondary | ICD-10-CM | POA: Diagnosis not present

## 2023-09-13 DIAGNOSIS — M6281 Muscle weakness (generalized): Secondary | ICD-10-CM | POA: Diagnosis not present

## 2023-09-13 DIAGNOSIS — M25551 Pain in right hip: Secondary | ICD-10-CM | POA: Diagnosis not present
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
## 2023-09-20 ENCOUNTER — Ambulatory Visit (INDEPENDENT_AMBULATORY_CARE_PROVIDER_SITE_OTHER): Payer: Medicare Other | Admitting: Sports Medicine

## 2023-09-20 ENCOUNTER — Other Ambulatory Visit (INDEPENDENT_AMBULATORY_CARE_PROVIDER_SITE_OTHER): Payer: Medicare Other

## 2023-09-20 ENCOUNTER — Ambulatory Visit: Payer: Medicare Other | Attending: Family Medicine | Admitting: Physical Therapy

## 2023-09-20 ENCOUNTER — Encounter: Payer: Self-pay | Admitting: Physical Therapy

## 2023-09-20 DIAGNOSIS — M722 Plantar fascial fibromatosis: Secondary | ICD-10-CM

## 2023-09-20 DIAGNOSIS — M79604 Pain in right leg: Secondary | ICD-10-CM | POA: Insufficient documentation

## 2023-09-20 DIAGNOSIS — M25551 Pain in right hip: Secondary | ICD-10-CM | POA: Diagnosis not present

## 2023-09-20 DIAGNOSIS — M259 Joint disorder, unspecified: Secondary | ICD-10-CM | POA: Insufficient documentation

## 2023-09-20 DIAGNOSIS — S43432A Superior glenoid labrum lesion of left shoulder, initial encounter: Secondary | ICD-10-CM

## 2023-09-20 DIAGNOSIS — R29898 Other symptoms and signs involving the musculoskeletal system: Secondary | ICD-10-CM | POA: Insufficient documentation

## 2023-09-20 DIAGNOSIS — M6281 Muscle weakness (generalized): Secondary | ICD-10-CM | POA: Diagnosis not present

## 2023-09-20 MED ORDER — TRIAMCINOLONE ACETONIDE 40 MG/ML IJ SUSP
40.0000 mg | Freq: Once | INTRAMUSCULAR | Status: AC
  Administered 2023-09-20: 40 mg via INTRAMUSCULAR

## 2023-09-20 NOTE — Assessment & Plan Note (Signed)
 Persistent pain left shoulder, she had positive labral signs including a positive grind test and minimally positive speeds test, rotator cuff testing was normal. 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 Proceeding with MR arthrography, this will both treat and diagnose. Continue Celebrex  as needed.

## 2023-09-20 NOTE — Addendum Note (Signed)
 Addended by: Carren Rang A on: 09/20/2023 04:11 PM   Modules accepted: Orders

## 2023-09-20 NOTE — Progress Notes (Signed)
    Procedures performed today:    Procedure: Real-time Ultrasound Guided injection of the right plantar fascial origin Device: Samsung HS60  Verbal informed consent obtained.  Time-out conducted.  Noted no overlying erythema, induration, or other signs of local infection.  Skin prepped in a sterile fashion.  Local anesthesia: Topical Ethyl chloride.  With sterile technique and under real time ultrasound guidance: Noted thickened plantar fascia, 1 cc lidocaine, 1 cc bupivacaine, 1 cc kenalog  40 injected easily. Completed without difficulty  Advised to call if fevers/chills, erythema, induration, drainage, or persistent bleeding.  Images permanently stored and available for review in PACS.  Impression: Technically successful ultrasound guided injection.  Independent interpretation of notes and tests performed by another provider:   None.  Brief History, Exam, Impression, and Recommendations:    Plantar fasciitis, right Clinically diagnosed plantar fasciitis, x-rays unrevealing with the exception of arthritis midfoot and insertional Achilles tendinosis. Unfortunately failed conservative treatment including avoidance of barefoot walking, home PT, orthotics. We did a plantar fascial injection today, return to see me in 6 weeks.  Labral tear of shoulder, left, initial encounter Persistent pain left shoulder, she had positive labral signs including a positive grind test and minimally positive speeds test, rotator cuff testing was normal. 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 Proceeding with MR arthrography, this will both treat and diagnose. Continue Celebrex  as needed.    ____________________________________________ Debby PARAS. Curtis, M.D., ABFM., CAQSM., AME. Primary Care and Sports Medicine [facility]  Adjunct Professor of [facility] Medicine  [employer]  [employer], [employer]

## 2023-09-20 NOTE — Code Documentation (Signed)
 done

## 2023-09-20 NOTE — Assessment & Plan Note (Signed)
 Clinically diagnosed plantar fasciitis, x-rays unrevealing with the exception of arthritis midfoot and insertional Achilles tendinosis. Unfortunately failed conservative treatment including avoidance of barefoot walking, home PT, orthotics. We did a plantar fascial injection today, return to see me in 6 weeks.
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
## 2023-09-24 ENCOUNTER — Ambulatory Visit: Payer: Medicare Other | Admitting: Physical Therapy

## 2023-09-24 ENCOUNTER — Encounter: Payer: Self-pay | Admitting: Physical Therapy

## 2023-09-24 DIAGNOSIS — M259 Joint disorder, unspecified: Secondary | ICD-10-CM

## 2023-09-24 DIAGNOSIS — M25551 Pain in right hip: Secondary | ICD-10-CM

## 2023-09-24 DIAGNOSIS — R29898 Other symptoms and signs involving the musculoskeletal system: Secondary | ICD-10-CM | POA: Diagnosis not present

## 2023-09-24 DIAGNOSIS — M79604 Pain in right leg: Secondary | ICD-10-CM | POA: Diagnosis not present

## 2023-09-24 DIAGNOSIS — M6281 Muscle weakness (generalized): Secondary | ICD-10-CM
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
## 2023-09-26 ENCOUNTER — Encounter: Payer: Self-pay | Admitting: Physical Therapy

## 2023-09-26 ENCOUNTER — Ambulatory Visit: Payer: Medicare Other | Admitting: Physical Therapy

## 2023-09-26 DIAGNOSIS — M259 Joint disorder, unspecified: Secondary | ICD-10-CM | POA: Diagnosis not present

## 2023-09-26 DIAGNOSIS — M25551 Pain in right hip: Secondary | ICD-10-CM | POA: Diagnosis not present

## 2023-09-26 DIAGNOSIS — M6281 Muscle weakness (generalized): Secondary | ICD-10-CM

## 2023-09-26 DIAGNOSIS — M79604 Pain in right leg: Secondary | ICD-10-CM

## 2023-09-26 DIAGNOSIS — R29898 Other symptoms and signs involving the musculoskeletal system: Secondary | ICD-10-CM

## 2023-09-26 NOTE — Patient Instructions (Signed)
 [facility] [phone] [facility] [address] [place] [address]   [facility] [place] [phone] [place] [phone] [place] [phone]  [facility]: [address], [address] [place], [place] [address] [place]: [address] [place], [place] [address] [place]:[address], [place], [place] [address]

## 2023-09-27 ENCOUNTER — Encounter: Payer: Self-pay | Admitting: Sports Medicine

## 2023-09-30 DIAGNOSIS — K080 Exfoliation of teeth due to systemic causes: Secondary | ICD-10-CM | POA: Diagnosis not present
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
## 2023-10-01 ENCOUNTER — Ambulatory Visit: Payer: Medicare Other | Admitting: Physical Therapy

## 2023-10-01 DIAGNOSIS — M6281 Muscle weakness (generalized): Secondary | ICD-10-CM | POA: Diagnosis not present

## 2023-10-01 DIAGNOSIS — R29898 Other symptoms and signs involving the musculoskeletal system: Secondary | ICD-10-CM

## 2023-10-01 DIAGNOSIS — M25551 Pain in right hip: Secondary | ICD-10-CM

## 2023-10-01 DIAGNOSIS — M79604 Pain in right leg: Secondary | ICD-10-CM | POA: Diagnosis not present

## 2023-10-01 DIAGNOSIS — M259 Joint disorder, unspecified: Secondary | ICD-10-CM | POA: Diagnosis not present

## 2023-10-03 ENCOUNTER — Encounter: Payer: Medicare Other | Admitting: Physical Therapy

## 2023-10-08 ENCOUNTER — Ambulatory Visit: Payer: Medicare Other | Admitting: Physical Therapy

## 2023-10-09 DIAGNOSIS — H0279 Other degenerative disorders of eyelid and periocular area: Secondary | ICD-10-CM | POA: Diagnosis not present

## 2023-10-09 DIAGNOSIS — H02413 Mechanical ptosis of bilateral eyelids: Secondary | ICD-10-CM | POA: Diagnosis not present

## 2023-10-09 DIAGNOSIS — H02831 Dermatochalasis of right upper eyelid: Secondary | ICD-10-CM | POA: Diagnosis not present

## 2023-10-15 ENCOUNTER — Encounter: Payer: Medicare Other | Admitting: Physical Therapy

## 2023-10-17 ENCOUNTER — Encounter: Payer: Medicare Other | Admitting: Physical Therapy

## 2023-10-23 DIAGNOSIS — H53483 Generalized contraction of visual field, bilateral: Secondary | ICD-10-CM | POA: Diagnosis not present

## 2023-11-15 DIAGNOSIS — M25561 Pain in right knee: Secondary | ICD-10-CM | POA: Diagnosis not present

## 2023-11-15 DIAGNOSIS — S8001XA Contusion of right knee, initial encounter: Secondary | ICD-10-CM | POA: Diagnosis not present

## 2023-11-15 DIAGNOSIS — Y9323 Activity, snow (alpine) (downhill) skiing, snow boarding, sledding, tobogganing and snow tubing: Secondary | ICD-10-CM | POA: Diagnosis not present

## 2023-11-19 DIAGNOSIS — H52223 Regular astigmatism, bilateral: Secondary | ICD-10-CM | POA: Diagnosis not present

## 2023-11-26 ENCOUNTER — Encounter: Payer: Self-pay | Admitting: Family Medicine

## 2024-01-28 DIAGNOSIS — K080 Exfoliation of teeth due to systemic causes: Secondary | ICD-10-CM | POA: Diagnosis not present

## 2024-04-22 ENCOUNTER — Ambulatory Visit (INDEPENDENT_AMBULATORY_CARE_PROVIDER_SITE_OTHER): Admitting: Physician Assistant

## 2024-04-22 ENCOUNTER — Encounter: Payer: Self-pay | Admitting: Physician Assistant

## 2024-04-22 VITALS — BP 120/68 | HR 70 | Temp 98.7°F | Wt 119.0 lb

## 2024-04-22 DIAGNOSIS — T63461A Toxic effect of venom of wasps, accidental (unintentional), initial encounter: Secondary | ICD-10-CM | POA: Diagnosis not present

## 2024-04-22 DIAGNOSIS — L03311 Cellulitis of abdominal wall: Secondary | ICD-10-CM | POA: Diagnosis not present

## 2024-04-22 MED ORDER — HYDROXYZINE HCL 10 MG PO TABS: ORAL_TABLET | ORAL | 0 refills | Status: DC

## 2024-04-22 MED ORDER — TRIAMCINOLONE ACETONIDE 0.1 % EX CREA: 1.0000 | TOPICAL_CREAM | Freq: Two times a day (BID) | CUTANEOUS | 0 refills | Status: DC

## 2024-04-22 MED ORDER — DOXYCYCLINE HYCLATE 100 MG PO TABS: 100.0000 mg | ORAL_TABLET | Freq: Two times a day (BID) | ORAL | 0 refills | Status: DC

## 2024-04-22 NOTE — Patient Instructions (Signed)
Bee, Wasp, or [employer], Adult Bees, wasps, and hornets are part of a family of insects that sting. Normally, a sting will cause pain, redness, and swelling at the sting site. However, some people have an allergy to these stings, and their reactions can be much more serious. What increases the risk? You may be at a greater risk of getting stung if you: Provoke a stinging insect by swatting or disturbing it. Wear strong-smelling soaps, deodorants, or body sprays. Spend time outdoors near gardens with flowers or fruit trees or in clothes that expose skin. Eat or drink outside. What are the signs or symptoms? The reaction to an insect sting can vary from a mild, normal response to life-threatening anaphylaxis. The sting site is often a red lump in the skin, sometimes with a tiny hole in the center, that may still have the stinger in the center of the wound. Normal reaction A normal reaction is experienced by most people after an insect sting. Symptoms include: Pain, redness, and swelling at the sting site. These can develop over 24-48 hours. Pain, redness, and swelling that may spread to a larger, connected area beyond the sting site. The spreading can continue over 24-48 hours. Allergic reaction An allergic reaction can vary in severity and includes symptoms in other areas of the body beyond the sting site. People who experience an allergic reaction have a higher risk of having similar or worse symptoms the next time they are stung. Symptoms may include: Hives, itching, and swelling. Abdominal symptoms including cramping, nausea, vomiting, and diarrhea. Severe symptoms that require immediate medical attention include: Chest pain or tightness. Wheezing or trouble breathing. Swelling of the tongue, throat, or lips. Trouble swallowing or hoarse voice. Anaphylactic reaction An anaphylactic reaction is a severe, life-threatening allergy and requires immediate medical attention. The symptoms often  include severe allergic reaction symptoms that develop rapidly and lead to: A sudden and sharp drop in blood pressure. Dizziness. Loss of consciousness. How is this diagnosed? This condition is usually diagnosed based on your symptoms and medical history as well as a physical exam. You may have an allergy test to determine if you are allergic to the insect venom. How is this treated? If you were stung by a bee, the stinger and a small sac of venom may be in the wound. Remove the stinger as soon as possible. Do this by brushing across the wound with gauze, a clean fingernail, or a flat card such as a credit card. This can help reduce the severity of your body's reaction to the sting. Normal reactions can be treated with: Washing the area thoroughly with soap and water. Applying ice to the area to reduce swelling. Oral or topical medicines to help reduce pain and itching, if present. Pay close attention to your symptoms after you have been stung. If possible, have someone stay with you to see if an allergic reaction develops. If allergic symptoms develop, oral antihistamines can be taken and you will need medical help right away. If you had an allergic reaction before, you may need to: Use an auto-injector "pen"(pre-filled automatic epinephrine injection device)at the first sign of an allergic reaction. Get medical help right away because the epinephrine is short-acting. It is intended to give you more time to get to an emergency room. Follow these instructions at home:  Wash the sting site 2-3 times a day with soap and water as told by your health care provider. Apply or take over-the-counter and prescription medicines only as told  by your health care provider. If directed, apply ice to the sting area. Put ice in a plastic bag. Place a towel between your skin and the bag. Leave the ice on for 20 minutes, 2-3 times a day. Do not scratch the sting area. If you had a severe allergic reaction to  a sting, you may need to: Wear a medical bracelet or necklace that lists the allergy. Carry an anaphylaxis kit or an epinephrine auto-injector "pen" with you at all times. Tell your family members, friends, and coworkers when and how to use it. Use it at the first sign of an allergic reaction. How is this prevented? Avoid swatting at stinging insects and disturbing insect nests. Do not use fragrant soaps or lotions and avoid sitting near flowering plants, if possible. Wear shoes, pants, and long sleeves when spending time outdoors, especially in grassy areas where stinging insects are common. Keep outdoor areas free from nests or hives. Keep food and drink containers covered when eating outdoors. Wear gloves if you are gardening or working outdoors. Find a barrier between you and the insect(s), such as a door, if an attack by a stinging insect or a swarm seems likely. Contact a health care provider if: Your symptoms do not get better in 2-3 days. You have redness, swelling, or pain that spreads beyond the area of the sting. You have a fever. Get help right away if: You have symptoms of a severe allergic reaction. These include: Chest tightness or pain. Wheezing, or trouble swallowing or breathing. Light-headedness, dizziness, or fainting. Itchy, raised, red patches on the skin beyond the sting site. Abdominal cramping, nausea, vomiting, or diarrhea. Trouble swallowing or a swollen tongue, throat, or lips. These symptoms may be an emergency. Get help right away. Call 911. Do not wait to see if the symptoms will go away. Do not drive yourself to the hospital. Summary Stings from bees, wasps, and hornets can cause pain and swelling, but they are usually not serious. However, some people may have an allergic reaction to a sting. This can cause the symptoms to be more severe. Pay close attention to your symptoms after you have been stung. If possible, have someone stay with you to look for  signs of worsening symptoms. Call your health care provider if you have any signs of an allergic reaction. This information is not intended to replace advice given to you by your health care provider. Make sure you discuss any questions you have with your health care provider. Document Revised: 10/31/2021 Document Reviewed: 10/31/2021 Elsevier Patient Education  2024 [employer].

## 2024-04-22 NOTE — Progress Notes (Signed)
   Acute Office Visit  Subjective:     Patient ID: Anna Pierce, female    DOB: Jul 06, 1956, 68 y.o.   MRN: [id number]   HPI Patient is in today for wasp/hornet/yellow jacket sting on the abdomen, 3 days ago. She noticed some pain in her shirt around her abdomen and slapped it. The insect flew out but had already stung her. Not sure if stinger is left in. She is having itchy, pain, redness, warmth in that area that seems to be spreading.  She has try benadryl but symptoms just keep getting worse. She has placed topical antibacterial ointment on it. She denies any lip swelling or problems breathing. No fever.    ROS See HPI.     Objective:    BP 120/68   Pulse 70   Temp 98.7 F (37.1 C) (Oral)   Wt 119 lb (54 kg)   SpO2 100%   BMI 20.43 kg/m  BP Readings from Last 3 Encounters:  04/22/24 120/68  09/03/23 (!) 110/59  08/23/23 112/72   Wt Readings from Last 3 Encounters:  04/22/24 119 lb (54 kg)  09/03/23 119 lb (54 kg)  08/23/23 120 lb (54.4 kg)      Physical Exam 3.75cm by 4cm area of redness and induration just above umbilicus with center 2mm bluish erythematous papule in the center. Warm and tender to touch.       Assessment & Plan:  Anna Pierce was seen today for insect bite.  Diagnoses and all orders for this visit:  Wasp sting, accidental or unintentional, initial encounter -     doxycycline  (VIBRA -TABS) 100 MG tablet; Take 1 tablet (100 mg total) by mouth 2 (two) times daily. -     hydrOXYzine  (ATARAX ) 10 MG tablet; Take one to two tablets as needed every 6 hours for itching. -     triamcinolone  cream (KENALOG ) 0.1 %; Apply 1 Application topically 2 (two) times daily. For itchy rash.  Cellulitis of abdominal wall -     doxycycline  (VIBRA -TABS) 100 MG tablet; Take 1 tablet (100 mg total) by mouth 2 (two) times daily.   Concern for cellulitis-start doxycycline  for 10 days Vitals are reassuring and no fever Area was cleaned with Hibiclens and made sure  with tweezers that no stinger was left Topical bacitracin was placed over sting entry and non stick bandage was placed over area Triamcinolone  to use over red itchy area and oral hydroxyzine  as needed  Use cool compresses to help with itch Follow up as needed if symptoms persist or worsen or change let clinic know   Vermell Bologna, PA-C

## 2024-05-19 ENCOUNTER — Encounter: Payer: Self-pay | Admitting: Sports Medicine

## 2024-06-02 DIAGNOSIS — K080 Exfoliation of teeth due to systemic causes: Secondary | ICD-10-CM | POA: Diagnosis not present

## 2024-08-10 DIAGNOSIS — K080 Exfoliation of teeth due to systemic causes: Secondary | ICD-10-CM | POA: Diagnosis not present

## 2024-08-12 ENCOUNTER — Ambulatory Visit: Payer: Self-pay

## 2024-08-12 DIAGNOSIS — K047 Periapical abscess without sinus: Secondary | ICD-10-CM | POA: Diagnosis not present

## 2024-08-12 DIAGNOSIS — Z880 Allergy status to penicillin: Secondary | ICD-10-CM | POA: Diagnosis not present

## 2024-08-12 DIAGNOSIS — R220 Localized swelling, mass and lump, head: Secondary | ICD-10-CM | POA: Diagnosis not present

## 2024-08-12 DIAGNOSIS — Z780 Asymptomatic menopausal state: Secondary | ICD-10-CM | POA: Diagnosis not present

## 2024-08-12 NOTE — Telephone Encounter (Signed)
 Kim, can you put her on for a virtual this afternoon before 430?

## 2024-08-12 NOTE — Telephone Encounter (Signed)
 FYI Only or Action Required?: FYI only for provider: ED advised.  Patient was last seen in primary care on 04/22/2024 by Antoniette Vermell CROME, PA-C.  Called Nurse Triage reporting Facial Swelling.  Symptoms began several days ago.  Interventions attempted: OTC medications: tylenol .  Symptoms are: gradually worsening.  Triage Disposition: Go to ED Now (Notify PCP)  Patient/caregiver understands and will follow disposition?: Yes    Copied from CRM [id number]. Topic: Clinical - Red Word Triage >> Aug 12, 2024  9:01 AM Myrick T wrote: [facility] that prompted transfer to Nurse Triage: patient had dental implant removed and now her gum is swollen, her neck and her lower left side of her face. Patient said the swelling has gotten worse. Reason for Disposition  [1] SEVERE face swelling (e.g., entire face) AND [2] < 2 hours since exposure to high-risk allergen (e.g., peanuts, tree nuts, fish, shellfish or 1st dose of drug) AND [3] no serious symptoms AND [4] no serious allergic reaction in the past  Answer Assessment - Initial Assessment Questions Advised ED now.  Advised 911 if symptoms worsen.  1. ONSET: When did the swelling start? (e.g., minutes, hours, days)     Yesterday, swelling face and now neck; dental implant on Monday; unable to reach dental office. 2. LOCATION: What part of the face is swollen? (e.g., cheek, entire face, jaw joint area, under jaw)    Left side; Gums, neck and facial swelling; ice and tylenol  still worsening 3. SEVERITY: How swollen is it?     severe 4. ITCHING: Is there any itching? If Yes, ask: How much?   (Scale 1-10; mild, moderate or severe)     no 5. PAIN: Is the swelling painful to touch? If Yes, ask: How painful is it?   (Scale 0-10; mild, moderate or severe)     no 6. FEVER: Do you have a fever? If Yes, ask: What is it, how was it measured, and when did it start?      Denies fever, chills, n/v 7. CAUSE: What do you think is causing the  face swelling?     Dental implant 8. NEW MEDICINES: Have there been any new medicines started recently?     Zpack; completed yesterday Patient reports previously took Penicillin prior to dental procedure and broke out into hives.  10. OTHER SYMPTOMS: Do you have any other symptoms? (e.g., leg swelling, toothache)       denies  Protocols used: Face Swelling-A-AH

## 2024-08-12 NOTE — Telephone Encounter (Signed)
 Spoke with the patient who states she went to the ER per RN triage.

## 2024-08-17 NOTE — Telephone Encounter (Signed)
 Patient scheduled for 12/02/025 with Dr. Alvan

## 2024-08-18 ENCOUNTER — Encounter: Payer: Self-pay | Admitting: Family Medicine

## 2024-08-18 ENCOUNTER — Ambulatory Visit: Admitting: Family Medicine

## 2024-08-18 VITALS — BP 129/66 | HR 71 | Ht 64.0 in | Wt 119.0 lb

## 2024-08-18 DIAGNOSIS — Z1159 Encounter for screening for other viral diseases: Secondary | ICD-10-CM

## 2024-08-18 DIAGNOSIS — Z0000 Encounter for general adult medical examination without abnormal findings: Secondary | ICD-10-CM | POA: Diagnosis not present

## 2024-08-18 DIAGNOSIS — F5101 Primary insomnia: Secondary | ICD-10-CM

## 2024-08-18 DIAGNOSIS — Z1231 Encounter for screening mammogram for malignant neoplasm of breast: Secondary | ICD-10-CM

## 2024-08-18 DIAGNOSIS — M272 Inflammatory conditions of jaws: Secondary | ICD-10-CM

## 2024-08-18 DIAGNOSIS — Z13220 Encounter for screening for lipoid disorders: Secondary | ICD-10-CM

## 2024-08-18 MED ORDER — TRAZODONE HCL 50 MG PO TABS: ORAL_TABLET | ORAL | 1 refills | Status: DC

## 2024-08-18 NOTE — Progress Notes (Signed)
 Complete physical exam  Patient: Melessa Cowell    DOB: 10/01/1955 68 y.o.   MRN: [id number]  Chief Complaint  Patient presents with   Annual Exam    Subjective:    Kayle Holster is a 68 y.o. female who presents today for a complete physical exam. She reports consuming a general diet. Exercises regularly with cardio and resistance training. She generally feels well. She reports sleeping well. She does not have additional problems to discuss today.   Discussed the use of AI scribe software for clinical note transcription with the patient, who gave verbal consent to proceed.  History of Present Illness Haylee Bayle is a 68 year old female who presents with a persistent infection following a dental procedure.  Post-procedural dental infection - Persistent severe infection following dental procedure, confirmed by blood work - Facial swelling and pain at the site of infection on left side of jaw - Initial significant hardness at the site, now reduced - Infection has spread beyond initial site - Improvement in symptoms today, first time in a month  Antibiotic therapy and allergic reactions - Allergic to penicillin - Delayed allergic reaction to clindamycin, manifested as rash after several days of use - Z-Pak prescribed but ineffective - Currently on a ten-day course of doxy, started on Wednesday  Respiratory infections - History of mild COVID-19 infections - Significant symptoms with colds, frequently progressing to pneumonia  Physical activity and supplement use - Engages in regular exercise including zone two training, high-intensity training, and resistance training - Uses protein supplements for muscle building   Most recent fall risk assessment:    08/18/2024    9:53 AM  Fall Risk   Falls in the past year? 0  Number falls in past yr: 0  Injury with Fall? 0  Risk for fall due to : No Fall Risks  Follow up Falls evaluation completed     Most recent  depression screenings:    08/18/2024    9:53 AM 09/03/2023   10:13 AM  PHQ 2/9 Scores  PHQ - 2 Score 0 0        Patient Care Team: Alvan Dorothyann BIRCH, MD as PCP - General (Family Medicine)   ROS    Objective:    BP 129/66   Pulse 71   Ht 5' 4 (1.626 m)   Wt 119 lb 0.6 oz (54 kg)   SpO2 99%   BMI 20.43 kg/m     Physical Exam Exam conducted with a chaperone present.  Constitutional:      Appearance: Normal appearance.  HENT:     Head: Normocephalic and atraumatic.     Right Ear: Tympanic membrane, ear canal and external ear normal.     Left Ear: Tympanic membrane, ear canal and external ear normal.     Nose: Nose normal.     Mouth/Throat:     Pharynx: Oropharynx is clear.  Eyes:     Extraocular Movements: Extraocular movements intact.     Conjunctiva/sclera: Conjunctivae normal.     Pupils: Pupils are equal, round, and reactive to light.  Neck:     Thyroid : No thyromegaly.  Cardiovascular:     Rate and Rhythm: Normal rate and regular rhythm.  Pulmonary:     Effort: Pulmonary effort is normal.     Breath sounds: Normal breath sounds.  Chest:     Chest wall: No mass.  Breasts:    Right: Normal. No mass, nipple discharge or skin change.  Left: Normal. No mass, nipple discharge or skin change.  Abdominal:     General: Bowel sounds are normal.     Palpations: Abdomen is soft.     Tenderness: There is no abdominal tenderness.  Musculoskeletal:        General: No swelling.     Cervical back: Neck supple.  Lymphadenopathy:     Upper Body:     Right upper body: No supraclavicular, axillary or pectoral adenopathy.     Left upper body: No supraclavicular, axillary or pectoral adenopathy.  Skin:    General: Skin is warm and dry.  Neurological:     Mental Status: She is oriented to person, place, and time.  Psychiatric:        Mood and Affect: Mood normal.        Behavior: Behavior normal.       No results found for any visits on 08/18/24.        Assessment & Plan:    Routine Health Maintenance and Physical Exam Immunization History  Administered Date(s) Administered   Moderna Sars-Covid-2 Vaccination 12/02/2019, 12/30/2019, 08/03/2020   PNEUMOCOCCAL CONJUGATE-20 01/21/2023   Pfizer(Comirnaty)Fall Seasonal Vaccine 12 years and older 07/30/2023   Tdap 06/30/2013   Zoster Recombinant(Shingrix ) 03/09/2019, 09/23/2019    Health Maintenance  Topic Date Due   Hepatitis C Screening  Never done   DTaP/Tdap/Td (2 - Td or Tdap) 07/01/2023   Mammogram  01/30/2024   Influenza Vaccine  04/17/2024   COVID-19 Vaccine (5 - 2025-26 season) 05/18/2024   Medicare Annual Wellness (AWV)  09/02/2024   Colonoscopy  01/13/2031   Pneumococcal Vaccine: 50+ Years  Completed   Bone Density Scan  Completed   Zoster Vaccines- Shingrix   Completed   Meningococcal B Vaccine  Aged Out    Discussed health benefits of physical activity, and encouraged her to engage in regular exercise appropriate for her age and condition.  Problem List Items Addressed This Visit       Other   Primary insomnia   Relevant Medications   traZODone  (DESYREL ) 50 MG tablet   Other Visit Diagnoses       Wellness examination    -  Primary     Need for hepatitis C screening test       Relevant Orders   Hepatitis C Antibody     Screening mammogram for breast cancer       Relevant Orders   MM 3D SCREENING MAMMOGRAM BILATERAL BREAST     Odontogenic infection of jaw       Relevant Medications   cefdinir (OMNICEF) 300 MG capsule   Other Relevant Orders   CMP14+EGFR   CBC with Differential/Platelet     Screening, lipid       Relevant Orders   Lipid panel       Assessment and Plan Assessment & Plan Adult Wellness Visit Annual wellness visit conducted. Discussed exercise benefits and reviewed immunization status. - Scheduled mammogram. - Ordered blood work including antibody titers for measles. - Plan for flu shot, COVID booster, and tetanus booster in a few  weeks.  Acute dental or jaw infection (currently on antibiotics) Acute dental infection post-tooth removal, initially mismanaged. Current antibiotics showing improvement. New dentist consulted. - Continue current antibiotic course. - Follow up with new dentist for further assessment.  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 - Avoid penicillins .    Return in about 2 weeks (around 09/01/2024) for Nurse visit for COVID  and flu vaccine  .    Dorothyann Byars, MD [facility] at [facility]

## 2024-08-19 DIAGNOSIS — K080 Exfoliation of teeth due to systemic causes: Secondary | ICD-10-CM | POA: Diagnosis not present

## 2024-08-20 ENCOUNTER — Other Ambulatory Visit: Payer: Self-pay | Admitting: Family Medicine

## 2024-08-20 DIAGNOSIS — M272 Inflammatory conditions of jaws: Secondary | ICD-10-CM

## 2024-08-20 DIAGNOSIS — Z0184 Encounter for antibody response examination: Secondary | ICD-10-CM

## 2024-08-20 DIAGNOSIS — E039 Hypothyroidism, unspecified: Secondary | ICD-10-CM

## 2024-08-20 DIAGNOSIS — M810 Age-related osteoporosis without current pathological fracture: Secondary | ICD-10-CM

## 2024-08-20 DIAGNOSIS — Z1159 Encounter for screening for other viral diseases: Secondary | ICD-10-CM

## 2024-08-20 DIAGNOSIS — Z13220 Encounter for screening for lipoid disorders: Secondary | ICD-10-CM

## 2024-08-20 NOTE — Progress Notes (Signed)
 Orders Placed This Encounter  Procedures   CMP14+EGFR   Lipid panel   CBC   Hepatitis C Antibody   Measles/Mumps/Rubella Immunity   TSH   VITAMIN D  25 Hydroxy (Vit-D Deficiency, Fractures)

## 2024-09-02 DIAGNOSIS — K080 Exfoliation of teeth due to systemic causes: Secondary | ICD-10-CM | POA: Diagnosis not present

## 2024-09-22 ENCOUNTER — Encounter (HOSPITAL_BASED_OUTPATIENT_CLINIC_OR_DEPARTMENT_OTHER): Payer: Self-pay

## 2024-09-22 ENCOUNTER — Ambulatory Visit (HOSPITAL_BASED_OUTPATIENT_CLINIC_OR_DEPARTMENT_OTHER)

## 2024-09-22 VITALS — Ht 64.0 in | Wt 119.0 lb

## 2024-09-22 DIAGNOSIS — Z0000 Encounter for general adult medical examination without abnormal findings: Secondary | ICD-10-CM
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
## 2024-10-08 ENCOUNTER — Encounter: Payer: Self-pay | Admitting: Family Medicine

## 2024-10-08 ENCOUNTER — Other Ambulatory Visit: Payer: Self-pay | Admitting: Family Medicine

## 2024-10-08 DIAGNOSIS — F5101 Primary insomnia: Secondary | ICD-10-CM

## 2024-10-17 LAB — MEASLES/MUMPS/RUBELLA IMMUNITY
MUMPS ABS, IGG: >300 [AU]/ml
RUBEOLA AB, IGG: >300 [AU]/ml
Rubella Antibodies, IGG: 2.77 {index}

## 2024-10-17 LAB — LIPID PANEL
Chol/HDL Ratio: 2.5 ratio (ref 0.0–4.4)
Cholesterol, Total: 183 mg/dL (ref 100–199)
HDL: 74 mg/dL
LDL Chol Calc (NIH): 99 mg/dL (ref 0–99)
Triglycerides: 51 mg/dL (ref 0–149)
VLDL Cholesterol Cal: 10 mg/dL (ref 5–40)

## 2024-10-17 LAB — CMP14+EGFR
ALT: 23 [IU]/L (ref 0–32)
AST: 29 [IU]/L (ref 0–40)
Albumin: 4.1 g/dL (ref 3.9–4.9)
Alkaline Phosphatase: 90 [IU]/L (ref 49–135)
BUN/Creatinine Ratio: 13 (ref 12–28)
BUN: 15 mg/dL (ref 8–27)
Bilirubin Total: 0.7 mg/dL (ref 0.0–1.2)
CO2: 23 mmol/L (ref 20–29)
Calcium: 9.1 mg/dL (ref 8.7–10.3)
Chloride: 100 mmol/L (ref 96–106)
Creatinine, Ser: 1.14 mg/dL — ABNORMAL HIGH (ref 0.57–1.00)
Globulin, Total: 2.1 g/dL (ref 1.5–4.5)
Glucose: 92 mg/dL (ref 70–99)
Potassium: 4.6 mmol/L (ref 3.5–5.2)
Sodium: 138 mmol/L (ref 134–144)
Total Protein: 6.2 g/dL (ref 6.0–8.5)
eGFR: 53 mL/min/{1.73_m2} — ABNORMAL LOW

## 2024-10-17 LAB — CBC
Hematocrit: 42.2 % (ref 34.0–46.6)
Hemoglobin: 14.3 g/dL (ref 11.1–15.9)
MCH: 31 pg (ref 26.6–33.0)
MCHC: 33.9 g/dL (ref 31.5–35.7)
MCV: 91 fL (ref 79–97)
Platelets: 299 10*3/uL (ref 150–450)
RBC: 4.62 x10E6/uL (ref 3.77–5.28)
RDW: 12 % (ref 11.7–15.4)
WBC: 3.8 10*3/uL (ref 3.4–10.8)

## 2024-10-17 LAB — HEPATITIS C ANTIBODY: Hep C Virus Ab: NONREACTIVE

## 2024-10-17 LAB — TSH: TSH: 2.76 u[IU]/mL (ref 0.450–4.500)

## 2024-10-17 LAB — VITAMIN D 25 HYDROXY (VIT D DEFICIENCY, FRACTURES): Vit D, 25-Hydroxy: 43 ng/mL (ref 30.0–100.0)

## 2024-10-19 ENCOUNTER — Ambulatory Visit: Payer: Self-pay | Admitting: Family Medicine

## 2024-10-19 NOTE — Progress Notes (Signed)
 Hi Jacoby, your kidney function is stable from last time but on the higher end andt your GFR is a little low. I would like to schedule you for a kidney US  if you are ok with that.  . Liver enzymes are normal.  Your cholesterol looks great. Blood count is normal.  You are Immune to Measles, mumps and Rubella. Thyroid  and vitamin D  look good.
